# Patient Record
Sex: Female | Born: 1995 | Hispanic: No | Marital: Married | Smoking: Current some day smoker
Health system: Southern US, Community
[De-identification: ages and names within clinical notes are randomized; demographics above are authoritative.]

## PROBLEM LIST (undated history)

## (undated) DIAGNOSIS — R87629 Unspecified abnormal cytological findings in specimens from vagina: Secondary | ICD-10-CM

## (undated) DIAGNOSIS — I10 Essential (primary) hypertension: Secondary | ICD-10-CM

## (undated) DIAGNOSIS — F319 Bipolar disorder, unspecified: Secondary | ICD-10-CM

## (undated) DIAGNOSIS — F32A Depression, unspecified: Secondary | ICD-10-CM

## (undated) DIAGNOSIS — F419 Anxiety disorder, unspecified: Secondary | ICD-10-CM

## (undated) HISTORY — DX: Essential (primary) hypertension: I10

## (undated) HISTORY — PX: SUPRAPUBIC CATHETER INSERTION: SUR719

## (undated) HISTORY — DX: Unspecified abnormal cytological findings in specimens from vagina: R87.629

## (undated) HISTORY — DX: Anxiety disorder, unspecified: F41.9

## (undated) HISTORY — DX: Depression, unspecified: F32.A

## (undated) HISTORY — PX: CYSTOSCOPY: SUR368

---

## 2020-01-12 LAB — CYTOLOGY - PAP

## 2020-02-22 ENCOUNTER — Emergency Department (HOSPITAL_COMMUNITY)
Admission: EM | Admit: 2020-02-22 | Discharge: 2020-02-23 | Disposition: A | Discharge status: Home / Self Care | Payer: Medicare PPO | Attending: Emergency Medicine | Admitting: Emergency Medicine

## 2020-02-22 ENCOUNTER — Emergency Department (HOSPITAL_COMMUNITY): Payer: Medicare PPO

## 2020-02-22 DIAGNOSIS — Z3A21 21 weeks gestation of pregnancy: Secondary | ICD-10-CM

## 2020-02-22 DIAGNOSIS — Z20822 Contact with and (suspected) exposure to covid-19: Secondary | ICD-10-CM | POA: Insufficient documentation

## 2020-02-22 DIAGNOSIS — O26892 Other specified pregnancy related conditions, second trimester: Secondary | ICD-10-CM | POA: Diagnosis not present

## 2020-02-22 DIAGNOSIS — R109 Unspecified abdominal pain: Secondary | ICD-10-CM | POA: Diagnosis not present

## 2020-02-22 DIAGNOSIS — O99344 Other mental disorders complicating childbirth: Secondary | ICD-10-CM | POA: Insufficient documentation

## 2020-02-22 DIAGNOSIS — R4182 Altered mental status, unspecified: Secondary | ICD-10-CM

## 2020-02-22 DIAGNOSIS — F32A Depression, unspecified: Secondary | ICD-10-CM

## 2020-02-22 DIAGNOSIS — F1994 Other psychoactive substance use, unspecified with psychoactive substance-induced mood disorder: Secondary | ICD-10-CM

## 2020-02-22 DIAGNOSIS — O98512 Other viral diseases complicating pregnancy, second trimester: Secondary | ICD-10-CM | POA: Diagnosis not present

## 2020-02-22 DIAGNOSIS — F1910 Other psychoactive substance abuse, uncomplicated: Secondary | ICD-10-CM

## 2020-02-22 HISTORY — DX: Bipolar disorder, unspecified: F31.9

## 2020-02-22 LAB — COMPREHENSIVE METABOLIC PANEL
ALT: 23 U/L (ref 0–44)
AST: 25 U/L (ref 15–41)
Albumin: 3.1 g/dL — ABNORMAL LOW (ref 3.5–5.0)
Alkaline Phosphatase: 63 U/L (ref 38–126)
Anion gap: 10 (ref 5–15)
BUN: 6 mg/dL (ref 6–20)
CO2: 21 mmol/L — ABNORMAL LOW (ref 22–32)
Calcium: 8.8 mg/dL — ABNORMAL LOW (ref 8.9–10.3)
Chloride: 107 mmol/L (ref 98–111)
Creatinine, Ser: 0.7 mg/dL (ref 0.44–1.00)
GFR, Estimated: >60 mL/min (ref >60)
Glucose, Bld: 97 mg/dL (ref 70–99)
Potassium: 3.4 mmol/L — ABNORMAL LOW (ref 3.5–5.1)
Sodium: 138 mmol/L (ref 135–145)
Total Bilirubin: 0.6 mg/dL (ref 0.3–1.2)
Total Protein: 6.6 g/dL (ref 6.5–8.1)

## 2020-02-22 LAB — CBC
HCT: 37.5 % (ref 36.0–46.0)
Hemoglobin: 12 g/dL (ref 12.0–15.0)
MCH: 28.8 pg (ref 26.0–34.0)
MCHC: 32 g/dL (ref 30.0–36.0)
MCV: 89.9 fL (ref 80.0–100.0)
Platelets: 356 10*3/uL (ref 150–400)
RBC: 4.17 MIL/uL (ref 3.87–5.11)
RDW: 13.7 % (ref 11.5–15.5)
WBC: 7.2 10*3/uL (ref 4.0–10.5)
nRBC: 0 % (ref 0.0–0.2)

## 2020-02-22 LAB — LACTIC ACID, PLASMA: Lactic Acid, Venous: 1.4 mmol/L (ref 0.5–1.9)

## 2020-02-22 LAB — CBG MONITORING, ED: Glucose-Capillary: 75 mg/dL (ref 70–99)

## 2020-02-22 LAB — ETHANOL: Alcohol, Ethyl (B): <10 mg/dL (ref <10)

## 2020-02-22 LAB — I-STAT BETA HCG BLOOD, ED (MC, WL, AP ONLY): I-stat hCG, quantitative: >2000 m[IU]/mL — ABNORMAL HIGH (ref <5)

## 2020-02-22 LAB — ACETAMINOPHEN LEVEL: Acetaminophen (Tylenol), Serum: <10 ug/mL — ABNORMAL LOW (ref 10–30)

## 2020-02-22 LAB — SALICYLATE LEVEL: Salicylate Lvl: <7 mg/dL — ABNORMAL LOW (ref 7.0–30.0)

## 2020-02-22 MED ORDER — SODIUM CHLORIDE 0.9 % IV BOLUS
1000.0000 mL | Freq: Once | INTRAVENOUS | Status: AC
  Administered 2020-02-22: 1000 mL via INTRAVENOUS

## 2020-02-22 NOTE — ED Provider Notes (Signed)
[facility]-EMERGENCY DEPT [facility] Emergency Department Provider Note MRN:  [id number]  Arrival date & time: 02/22/20     Chief Complaint   Altered mental status History of Present Illness   Michelle [name] is a 24 y.o. year-old female with unknown past medical history presenting to the ED with chief complaint of altered mental status.  Patient is reportedly pregnant and there is concern that she overdosed on some type of drug or substance.  She is somnolent and unable to answer questions.  I was unable to obtain an accurate HPI, PMH, or ROS due to the patient's altered mental status.  Level 5 caveat.  Review of Systems  Positive for altered mental status  Patient's Health History   No past medical history on file.    No family history on file.  Social History   Socioeconomic History  . Marital status: Unknown    Spouse name: Not on file  . Number of children: Not on file  . Years of education: Not on file  . Highest education level: Not on file  Occupational History  . Not on file  Tobacco Use  . Smoking status: Not on file  Substance and Sexual Activity  . Alcohol use: Not on file  . Drug use: Not on file  . Sexual activity: Not on file  Other Topics Concern  . Not on file  Social History Narrative  . Not on file   Social Determinants of Health   Financial Resource Strain:   . Difficulty of Paying Living Expenses: Not on file  Food Insecurity:   . Worried About Programme researcher, broadcasting/film/video in the Last Year: Not on file  . Ran Out of Food in the Last Year: Not on file  Transportation Needs:   . Lack of Transportation (Medical): Not on file  . Lack of Transportation (Non-Medical): Not on file  Physical Activity:   . Days of Exercise per Week: Not on file  . Minutes of Exercise per Session: Not on file  Stress:   . Feeling of Stress : Not on file  Social Connections:   . Frequency of Communication with Friends and Family: Not on file  . Frequency of Social  Gatherings with Friends and Family: Not on file  . Attends Religious Services: Not on file  . Active Member of Clubs or Organizations: Not on file  . Attends Banker Meetings: Not on file  . Marital Status: Not on file  Intimate Partner Violence:   . Fear of Current or Ex-Partner: Not on file  . Emotionally Abused: Not on file  . Physically Abused: Not on file  . Sexually Abused: Not on file     Physical Exam   Vitals:   02/22/20 2200 02/22/20 2215  BP: 105/69 105/73  Pulse: 92 90  Resp: (!) 24 16  Temp:    SpO2: 100% 100%    CONSTITUTIONAL: Well-appearing, NAD NEURO: Somnolent, responds to voice, does not open eyes follows commands, moves all extremities 1 or 2 beats of clonus lower extremities, pupils 3 mm EYES:  eyes equal and reactive ENT/NECK:  no LAD, no JVD CARDIO: Regular rate, well-perfused, normal S1 and S2 PULM:  CTAB no wheezing or rhonchi GI/GU:  normal bowel sounds, non-distended, non-tender MSK/SPINE:  No gross deformities, no edema SKIN:  no rash, atraumatic PSYCH:  Appropriate speech and behavior  *Additional and/or pertinent findings included in MDM below  Diagnostic and Interventional Summary    EKG Interpretation  Date/Time:  Wednesday February 22 2020 18:56:30 EDT Ventricular Rate:  101 PR Interval:    QRS Duration: 80 QT Interval:  341 QTC Calculation: 442 R Axis:   46 Text Interpretation: Sinus tachycardia RSR' in V1 or V2, probably normal variant Baseline wander in lead(s) V5 No previous ECGs available Confirmed by Kennis Carina [phone]) on 02/22/2020 7:03:05 PM      Labs Reviewed  COMPREHENSIVE METABOLIC PANEL - Abnormal; Notable for the following components:      Result Value   Potassium 3.4 (*)    CO2 21 (*)    Calcium 8.8 (*)    Albumin 3.1 (*)    All other components within normal limits  ACETAMINOPHEN LEVEL - Abnormal; Notable for the following components:   Acetaminophen (Tylenol), Serum <10 (*)    All other  components within normal limits  SALICYLATE LEVEL - Abnormal; Notable for the following components:   Salicylate Lvl <7.0 (*)    All other components within normal limits  I-STAT BETA HCG BLOOD, ED ([facility], [facility], [facility] ONLY) - Abnormal; Notable for the following components:   I-stat hCG, quantitative >2,000.0 (*)    All other components within normal limits  RESPIRATORY PANEL BY RT PCR (FLU A&B, COVID)  CBC  ETHANOL  LACTIC ACID, PLASMA  URINALYSIS, ROUTINE W REFLEX MICROSCOPIC  RAPID URINE DRUG SCREEN, HOSP PERFORMED  PROTIME-INR  CBG MONITORING, ED    DG Chest Port 1 View  Final Result      Medications  sodium chloride 0.9 % bolus 1,000 mL (0 mLs Intravenous Stopped 02/22/20 2047)     Procedures  /  Critical Care .Critical Care Performed by: Sabas Sous, MD Authorized by: Sabas Sous, MD   Critical care provider statement:    Critical care time (minutes):  35   Critical care was necessary to treat or prevent imminent or life-threatening deterioration of the following conditions:  Toxidrome   Critical care was time spent personally by me on the following activities:  Discussions with consultants, evaluation of patient's response to treatment, examination of patient, ordering and performing treatments and interventions, ordering and review of laboratory studies, ordering and review of radiographic studies, pulse oximetry, re-evaluation of patient's condition, obtaining history from patient or surrogate and review of old charts    ED Course and Medical Decision Making  I have reviewed the triage vital signs, the nursing notes, and pertinent available records from the EMR.  Listed above are laboratory and imaging tests that I personally ordered, reviewed, and interpreted and then considered in my medical decision making (see below for details).  Concern for overdose as the cause of patient's altered mental status, she is able to follow commands and move all extremities, here in  the emergency department she is protecting airway, 100% on room air, normal heart rate and blood pressure.  Awaiting labs, will monitor closely.     Patient is awake, alert, conversant. Labs reassuring. Normal neurological exam at this time. She denies drug or alcohol use. She is tearful, she admits to recent depression. She denies SI or HI but seems to be withholding information. She would appreciate mental health evaluation today. Will consult TTS. She is medically cleared. She is pregnant, she is unsure how far along she is, she has not followed up, she is unsure of her last menstrual period. Signed out to default provider at shift change.  Elmer Sow. Pilar Plate, MD [facility] Emergency Medicine [facility] [email or web address]  Final Clinical Impressions(s) / ED  Diagnoses     ICD-10-CM   1. Altered mental status, unspecified altered mental status type  R41.82   2. Depression, unspecified depression type  F32.A     ED Discharge Orders    None       Discharge Instructions Discussed with and Provided to Patient:   Discharge Instructions   None       Sabas Sous, MD 02/22/20 2230

## 2020-02-22 NOTE — ED Notes (Signed)
Pt ambulated well to the BR at this time with no signs of gait/mobility issues.

## 2020-02-22 NOTE — ED Notes (Signed)
Pt given turkey sandwich and orange juice

## 2020-02-22 NOTE — ED Triage Notes (Addendum)
Pt arrived to ED via ems. Per ems boyfriend called because pt became unresponsive. Boyfriend admitted to "smoking some kind of drug"  Also reported pt is 5 months pregnant, but does not know any hx of pt. Upon ems arrival pt unresponsive, gcs 9, airway intact and was hypotensive. On arrival to ED pt responsive to pain, pupils are 78mm and reactive to light, no signs of trauma noted. No obvious distended uterus palpated.

## 2020-02-22 NOTE — ED Notes (Signed)
Pt was instructed to provide a urine sample if able to be processed. Upon return, pt stated she forgot to provide one.

## 2020-02-23 ENCOUNTER — Emergency Department (HOSPITAL_COMMUNITY): Payer: Medicare PPO

## 2020-02-23 ENCOUNTER — Encounter (HOSPITAL_COMMUNITY): Payer: Self-pay | Admitting: Emergency Medicine

## 2020-02-23 DIAGNOSIS — O99344 Other mental disorders complicating childbirth: Secondary | ICD-10-CM | POA: Diagnosis not present

## 2020-02-23 LAB — URINALYSIS, ROUTINE W REFLEX MICROSCOPIC
Bilirubin Urine: NEGATIVE
Glucose, UA: NEGATIVE mg/dL
Hgb urine dipstick: NEGATIVE
Ketones, ur: NEGATIVE mg/dL
Leukocytes,Ua: NEGATIVE
Nitrite: NEGATIVE
Protein, ur: NEGATIVE mg/dL
Specific Gravity, Urine: 1.017 (ref 1.005–1.030)
pH: 6 (ref 5.0–8.0)

## 2020-02-23 LAB — RESPIRATORY PANEL BY RT PCR (FLU A&B, COVID)
Influenza A by PCR: NEGATIVE
Influenza B by PCR: NEGATIVE
SARS Coronavirus 2 by RT PCR: NEGATIVE

## 2020-02-23 LAB — RAPID URINE DRUG SCREEN, HOSP PERFORMED
Amphetamines: POSITIVE — AB
Barbiturates: NOT DETECTED
Benzodiazepines: NOT DETECTED
Cocaine: NOT DETECTED
Opiates: NOT DETECTED
Tetrahydrocannabinol: POSITIVE — AB

## 2020-02-23 LAB — PROTIME-INR
INR: 1.1 (ref 0.8–1.2)
Prothrombin Time: 13.5 seconds (ref 11.4–15.2)

## 2020-02-23 LAB — HCG, QUANTITATIVE, PREGNANCY: hCG, Beta Chain, Quant, S: 15753 m[IU]/mL — ABNORMAL HIGH (ref <5)

## 2020-02-23 NOTE — ED Notes (Signed)
Patient transported to Ultrasound 

## 2020-02-23 NOTE — ED Notes (Signed)
TTS in process 

## 2020-02-23 NOTE — BH Assessment (Signed)
Comprehensive Clinical Assessment (CCA) Note  02/23/2020 Michelle [name] [id number]   Michelle [name] is a 24 year old female presenting voluntary to [facility] via EMS. When asked, why are you here, patient stated, "we were going on a walk when I passed out". Patient denied drug usage, attempted overdose and self-harming behaviors. Patient reported increased anxiety and denied depressive symptoms. Patient denied SI and HI. Patient reported visual hallucinations of seeing people talk about her. Patient denied any command verbal hallucinations. Patient denied history of psych inpatient treatment and suicide attempts. Patient reported being 5 months pregnant. Patient was calm and cooperative during assessment. Patient denied access to guns.   Collateral Contact Michelle [name], [phone], consented by patient. Michelle [name] reported no concern regarding patient hurting herself. Patient reported living with fiancy. Patient reported he and patient were going for a walk when she "black out". Michelle [name] reported that patient has been off her psych medications for 3 months. Per Michelle [name], no additional concerns regarding patient safety.   Per ED Triage Note Per ems boyfriend called because pt became unresponsive. Boyfriend admitted to "smoking some kind of drug"  Also reported pt is 5 months pregnant, but does not know any hx of pt. Upon ems arrival pt unresponsive, gcs 9, airway intact and was hypotensive. On arrival to ED pt responsive to pain, pupils are 41mm and reactive to light, no signs of trauma noted.   Disposition Michelle Conn, NP, recommends observation for safety and stabilization with psych reassessment in the AM.  Visit Diagnosis:      ICD-10-CM   1. Altered mental status, unspecified altered mental status type  R41.82   2. Depression, unspecified depression type  F32.A   3. Abdominal cramping  R10.9 US OB LESS THAN 14 WEEKS WITH OB TRANSVAGINAL    US OB LESS THAN 14 WEEKS WITH OB TRANSVAGINAL       CCA Screening, Triage and Referral (STR)  Patient Reported Information How did you hear about [place]? Family/Friend  Referral name: No data recorded Referral phone number: No data recorded  Whom do you see for routine medical problems? Primary Care  Practice/Facility Name: No data recorded Practice/Facility Phone Number: No data recorded Name of Contact: No data recorded Contact Number: No data recorded Contact Fax Number: No data recorded Prescriber Name: No data recorded Prescriber Address (if known): No data recorded  What Is the Reason for Your Visit/Call Today? No data recorded How Long Has This Been Causing You Problems? <Week  What Do You Feel Would Help You the Most Today? Medication   Have You Recently Been in Any Inpatient Treatment (Hospital/Detox/Crisis Center/28-Day Program)? No  Name/Location of Program/Hospital:No data recorded How Long Were You There? No data recorded When Were You Discharged? No data recorded  Have You Ever Received Services From [facility] Before? No  Who Do You See at [facility]? No data recorded  Have You Recently Had Any Thoughts About Hurting Yourself? Yes  Are You Planning to Commit Suicide/Harm Yourself At This time? Yes   Have you Recently Had Thoughts About Hurting Someone Michelle [name]? No  Explanation: No data recorded  Have You Used Any Alcohol or Drugs in the Past 24 Hours? No  How Long Ago Did You Use Drugs or Alcohol? No data recorded What Did You Use and How Much? No data recorded  Do You Currently Have a Therapist/Psychiatrist? No  Name of Therapist/Psychiatrist: No data recorded  Have You Been Recently Discharged From Any Office Practice or Programs? No  Explanation of  Discharge From Practice/Program: No data recorded    CCA Screening Triage Referral Assessment Type of Contact: Tele-Assessment  Is this Initial or Reassessment? Initial Assessment  Date Telepsych consult ordered in [facility]:  02/22/20  Time  Telepsych consult ordered in [facility]:  2337   Patient Reported Information Reviewed? Yes  Patient Left Without Being Seen? No data recorded Reason for Not Completing Assessment: No data recorded  Collateral Involvement: grandfather   Does Patient Have a Court Appointed Legal Guardian? No data recorded Name and Contact of Legal Guardian: No data recorded If Minor and Not Living with Parent(s), Who has Custody? No data recorded Is CPS involved or ever been involved? Never  Is APS involved or ever been involved? Never   Patient Determined To Be At Risk for Harm To Self or Others Based on Review of Patient Reported Information or Presenting Complaint? Yes, for Self-Harm  Method: No data recorded Availability of Means: No data recorded Intent: No data recorded Notification Required: No data recorded Additional Information for Danger to Others Potential: No data recorded Additional Comments for Danger to Others Potential: No data recorded Are There Guns or Other Weapons in Your Home? No data recorded Types of Guns/Weapons: No data recorded Are These Weapons Safely Secured?                            No data recorded Who Could Verify You Are Able To Have These Secured: No data recorded Do You Have any Outstanding Charges, Pending Court Dates, Parole/Probation? No data recorded Contacted To Inform of Risk of Harm To Self or Others: Family/Significant Other:   Location of Assessment: [facility] ED   Does Patient Present under Involuntary Commitment? No  IVC Papers Initial File Date: No data recorded  [place] of Residence: [place]   Patient Currently Receiving the Following Services: Not Receiving Services   Determination of Need: Emergent (2 hours)   Options For Referral: Inpatient Hospitalization;Medication Management;Intensive Outpatient Therapy     CCA Biopsychosocial  Intake/Chief Complaint:  CCA Intake With Chief Complaint Chief Complaint/Presenting Problem: SI with  attempted overdose Individual's Strengths: follows directions  Mental Health Symptoms Depression:  Depression: None  Mania:  Mania: None  Anxiety:   Anxiety: None  Psychosis:  Psychosis: None  Trauma:  Trauma: None  Obsessions:  Obsessions: None  Compulsions:  Compulsions: None  Inattention:  Inattention: None  Hyperactivity/Impulsivity:  Hyperactivity/Impulsivity: N/A  Oppositional/Defiant Behaviors:  Oppositional/Defiant Behaviors: None  Emotional Irregularity:  Emotional Irregularity: None  Other Mood/Personality Symptoms:  Other Mood/Personality Symptoms: pregnancy   Mental Status Exam Appearance and self-care  Stature:  Stature: Average  Weight:  Weight: Average weight  Clothing:  Clothing: Age-appropriate  Grooming:  Grooming: Normal  Cosmetic use:     Posture/gait:  Posture/Gait: Normal  Motor activity:  Motor Activity: Not Remarkable  Sensorium  Attention:     Concentration:  Concentration: Normal  Orientation:  Orientation: Object, Person, Place, Situation  Recall/memory:  Recall/Memory: Normal  Affect and Mood  Affect:  Affect: Anxious, Appropriate, Depressed  Mood:  Mood: Depressed  Relating  Eye contact:  Eye Contact: Normal  Facial expression:  Facial Expression: Sad, Anxious  Attitude toward examiner:  Attitude Toward Examiner: Cooperative, Hostile  Thought and Language  Speech flow:    Thought content:     Preoccupation:     Hallucinations:     Organization:     Company secretary of Knowledge:     Intelligence:  Abstraction:     Judgement:     Reality Testing:     Insight:     Decision Making:     Social Functioning  Social Maturity:     Social Judgement:     Stress  Stressors:     Coping Ability:     Skill Deficits:     Supports:        Religion:    Leisure/Recreation:    Exercise/Diet:     CCA Employment/Education  Employment/Work Situation: Employment / Work Situation Employment situation: Unemployed Has  patient ever been in the [employer] and Companymilitary?: No  Education: Education Is Patient Currently Attending School?: No Last Grade Completed: 8 Did Garment/textile technologistYou Graduate From [employer] School?: No Did You Product managerAttend College?: No Did Designer, television/film setYou Attend Graduate School?: No   CCA Family/Childhood History  Family and Relationship History: Family history Marital status: Long term relationship Does patient have children?: No  Childhood History:  Childhood History Did patient suffer any verbal/emotional/physical/sexual abuse as a child?: No Did patient suffer from severe childhood neglect?: No Has patient ever been sexually abused/assaulted/raped as an adolescent or adult?: No Was the patient ever a victim of a crime or a disaster?: No Witnessed domestic violence?: No Has patient been affected by domestic violence as an adult?: No  Child/Adolescent Assessment:     CCA Substance Use  Alcohol/Drug Use: Alcohol / Drug Use Pain Medications: see MAR Prescriptions: see MAR Over the Counter: see MAR History of alcohol / drug use?: No history of alcohol / drug abuse    Recommendations for Services/Supports/Treatments:    DSM5 Diagnoses: There are no problems to display for this patient.    Debbe OdeaLatisha D AlstonComprehensive Clinical Assessment (CCA) Screening, Triage and Referral Note  02/23/2020 Roney MarionDaniela [name] [id number]  Visit Diagnosis:    ICD-10-CM   1. Altered mental status, unspecified altered mental status type  R41.82   2. Depression, unspecified depression type  F32.A   3. Abdominal cramping  R10.9 US OB LESS THAN 14 WEEKS WITH OB TRANSVAGINAL    US OB LESS THAN 14 WEEKS WITH OB TRANSVAGINAL    Patient Reported Information How did you hear about us? Family/Friend   Referral name: No data recorded  Referral phone number: No data recorded Whom do you see for routine medical problems? Primary Care   Practice/Facility Name: No data recorded  Practice/Facility Phone Number: No data recorded  Name of  Contact: No data recorded  Contact Number: No data recorded  Contact Fax Number: No data recorded  Prescriber Name: No data recorded  Prescriber Address (if known): No data recorded What Is the Reason for Your Visit/Call Today? No data recorded How Long Has This Been Causing You Problems? <Week  Have You Recently Been in Any Inpatient Treatment (Hospital/Detox/Crisis Center/28-Day Program)? No   Name/Location of Program/Hospital:No data recorded  How Long Were You There? No data recorded  When Were You Discharged? No data recorded [facility]? [facility]   [facility] No data recorded Have You Recently Had Any Thoughts About Hurting Yourself? Yes   Are You Planning to Commit Suicide/Harm Yourself At This time?  Yes  Have you Recently Had Thoughts About Hurting Someone Michelle Ohslse? No   Explanation: No data recorded Have You Used Any Alcohol or Drugs in the Past 24 Hours? No   How Long Ago Did You Use Drugs or Alcohol?  No data recorded  What Did You Use and How Much?  No data recorded What Do You Feel Would Help You the Most Today? Medication  Do You Currently Have a Therapist/Psychiatrist? No   Name of Therapist/Psychiatrist: No data recorded  Have You Been Recently Discharged From Any Office Practice or Programs? No   Explanation of Discharge From Practice/Program:  No data recorded    CCA Screening Triage Referral Assessment Type of Contact: Tele-Assessment   Is this Initial or Reassessment? Initial Assessment   Date Telepsych consult ordered in [facility]:  02/22/20   Time Telepsych consult ordered in [facility]:  2337  Patient Reported Information Reviewed? Yes   Patient Left Without Being Seen? No data recorded  Reason for Not Completing Assessment: No data recorded Collateral Involvement: grandfather  Does Patient Have a Court Appointed Legal Guardian? No data recorded  Name and Contact of Legal Guardian:  No data  recorded If Minor and Not Living with Parent(s), Who has Custody? No data recorded Is CPS involved or ever been involved? Never  Is APS involved or ever been involved? Never  Patient Determined To Be At Risk for Harm To Self or Others Based on Review of Patient Reported Information or Presenting Complaint? Yes, for Self-Harm   Method: No data recorded  Availability of Means: No data recorded  Intent: No data recorded  Notification Required: No data recorded  Additional Information for Danger to Others Potential:  No data recorded  Additional Comments for Danger to Others Potential:  No data recorded  Are There Guns or Other Weapons in Your Home?  No data recorded   Types of Guns/Weapons: No data recorded   Are These Weapons Safely Secured?                              No data recorded   Who Could Verify You Are Able To Have These Secured:    No data recorded Do You Have any Outstanding Charges, Pending Court Dates, Parole/Probation? No data recorded Contacted To Inform of Risk of Harm To Self or Others: Family/Significant Other:  Location of Assessment: [facility] ED  Does Patient Present under Involuntary Commitment? No   IVC Papers Initial File Date: No data recorded  [place] of Residence: [place]  Patient Currently Receiving the Following Services: Not Receiving Services   Determination of Need: Emergent (2 hours)   Options For Referral: Inpatient Hospitalization;Medication Management;Intensive Outpatient Therapy   Burnetta Sabin, [facility]

## 2020-02-23 NOTE — ED Notes (Signed)
RN called OB to assess patient; EDP notified to consult with on call OB to determine need for [place]; Pt reports she feels baby move "all the time". Pt does not know LMP nor how far along she could be; On assessment patient does have a slight "baby bump" with firmness noted; Pt has no complaints at this time-Monique,RN

## 2020-02-23 NOTE — ED Notes (Signed)
Patient given phone to return call to [facility]

## 2020-02-23 NOTE — Patient Outreach (Signed)
ED Peer Support Specialist Patient Intake (Complete at intake & 30-60 Day Follow-up)  Name: Michelle [name]  MRN: [id number]  Age: 24 y.o.   Date of Admission: 02/23/2020  Intake: Initial Comments:      Primary Reason Admitted: Altered Mental Status   Lab values: Alcohol/ETOH: Positive Positive UDS? Yes Amphetamines: Yes Barbiturates: No Benzodiazepines: No Cocaine: No Opiates: No Cannabinoids: Yes  Demographic information: Gender: Female Ethnicity: African American Marital Status: Unknown Insurance Status: Medicaid Engineer, production assistance (Work Engineer, agricultural, Sales executive, etc.: Yes Lives with: Other (comment) Living situation: House/Apartment  Reported Patient History: Patient reported health conditions: Schizoaffective disorder, Depression, Anxiety disorders Patient aware of HIV and hepatitis status: No  In past year, has patient visited ED for any reason? No  Number of ED visits:    Reason(s) for visit:    In past year, has patient been hospitalized for any reason? Yes  Number of hospitalizations:    Reason(s) for hospitalization:    In past year, has patient been arrested? No  Number of arrests:    Reason(s) for arrest:    In past year, has patient been incarcerated? No  Number of incarcerations:    Reason(s) for incarceration:    In past year, has patient received medication-assisted treatment? No  In past year, patient received the following treatments:    In past year, has patient received any harm reduction services? No  Did this include any of the following?    In past year, has patient received care from a mental health provider for diagnosis other than SUD? No  In past year, is this first time patient has overdosed? No  Number of past overdoses:    In past year, is this first time patient has been hospitalized for an overdose? No  Number of hospitalizations for overdose(s):    Is patient currently receiving treatment  for a mental health diagnosis? No  Patient reports experiencing difficulty participating in SUD treatment: No    Most important reason(s) for this difficulty?    Has patient received prior services for treatment? No  In past, patient has received services from following agencies:    Plan of Care:  Suggested follow up at these agencies/treatment centers: ACTT ([facility]ive Community Treatment Team)  Other information: CPSS was able to complete series of questions but was unable to gain positive information about what services Pt was seeking. CPSS was made aware that Pt wanted to continue to be stable with her Medications. CPSS processed with Pt about ACTT services an explained to Pt how she may benefit from ACTT services. CPSS contacted ACTT team in front of Pt an was able to get Pt set up with an Assessment, on Mar 06, 2020 at 11:30. CPSS explained to Pt what she needs ro do an also that CPSS was assist Pt with getting set up during that time.     Arlys John Zyla Dascenzo, CPSS  02/23/2020 11:33 AM

## 2020-02-23 NOTE — ED Notes (Signed)
Patient verbalizes understanding of discharge instructions. Opportunity for questioning and answers were provided. Pt discharged from ED. 

## 2020-02-23 NOTE — Progress Notes (Signed)
Patient ID: Michelle [name], female   DOB: December 14, 1995, 24 y.o.   MRN: [id number]   Psychiatric Reassessment   HPI: Michelle [name] is a 24 year old female presenting voluntary to [facility] via EMS. When asked, why are you here, patient stated, "we were going on a walk when I passed out". Patient denied drug usage, attempted overdose and self-harming behaviors. Patient reported increased anxiety and denied depressive symptoms. Patient denied SI and HI. Patient reported visual hallucinations of seeing people talk about her. Patient denied any command verbal hallucinations. Patient denied history of psych inpatient treatment and suicide attempts. Patient reported being 5 months pregnant. Patient was calm and cooperative during assessment. Patient denied access to guns.   Collateral Contact Sharlet Salina, [phone], consented by patient. Molli Hazard reported no concern regarding patient hurting herself. Patient reported living with fiancy. Patient reported he and patient were going for a walk when she "black out". Molli Hazard reported that patient has been off her psych medications for 3 months. Per Molli Hazard, no additional concerns regarding patient safety.   Per ED Triage Note Per ems boyfriend called because pt became unresponsive. Boyfriend admitted to "smoking some kind of drug" Also reported pt is 5 months pregnant, but does not know any hx of pt. Upon ems arrival pt unresponsive, gcs 9, airway intact and was hypotensive. On arrival to ED pt responsive to pain, pupils are 63mm and reactive to light, no signs of trauma noted.   Psychiatric Evaluation 02/23/2020: Michelle [name] is a 24 year old female presenting voluntary to [facility] via EMS. Per review of chart, patient was found unresponsives by her boyfriend who stated she had smoked some kind of drug. Patient was then transported to the ED for evaluation.  During this evaluation, patient was alert and oriented x4, calm and cooperative. She stated that  she did not recall much of the incident prior to arrival to the ED but did recall that she and her boyfriend had plans to go walking. Stated she told her boyfriend that she was not feeling well and when she stood up, she must have passed out. She denied using any substances prior to the incident and initially denied history of substance abuse. When advised that her UDS was positive for amphetamines and mariajuana,  She admitted to using meth although stated the last time she used was a week ago. She admitted to using marijuana but denied other substance abuse. When asked more questions about her substance abuse history she mostly replied," I cant remember or I don't know." She was very vague about her substance abuse and insight was poor. She denied current suicidal or homicidal thoughts. .She reported both auditory and visual hallucinations described as," I cant understands the voices. I cant describe what I see." She insist that the hallucination occur without meth use. Reported a psychiatric history of Bipolar, schizophrenia, depression and anxiety but stated she has not taken her medications in 5 months after finding out that he was pregnant  Reported she does receive prenatal care but could not recall where. Reported she receives outpatient psychiatric services with Dr. Jonny Ruiz at the [facility]. Reported prior suicide attempts but stated she could not remember the last attempt. Denied history of NSSIB. Denied access to firearms. Denied feelings of depression. When we discussed her interest in substance abuse services and that resources could be provided she declined.  She gave verbal consent to speak to her fiance Molli Hazard, [phone]  I spoke to patients fiance who stated he had no concerns  that patient  would hurt herself or that she tried to hurt herself.Reported his concern was what caused her to pass out. He was advised that I could not provide additional information in regards to patients medically  reports however, psychiatrically, patient was stable and did not quire an inpatient psychiatric admission. He was receptive. He endorsed no other concerns at this time.   .   Disposition: Patient denies SI, HI and psychosis. Although she has a history of substance abuse, she minimizes. She stated that she was bot open to resources for substance treatment although I have asked CSW to fax over resources. Stated that she would continue to follow-up with the [facility] for outpatient psychiatric services and follow-up with her doctor for longing [facility] care.    At this time, there is no evidence of imminent risk to self or others at present nor evidence of psychosis. Patient does not meet criteria for psychiatric inpatient admission and is therefore, psychiatrically cleared.    ED updated on disposition.

## 2020-02-23 NOTE — ED Notes (Signed)
ED Provider at bedside. 
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
## 2020-02-23 NOTE — ED Notes (Signed)
Updated pt's fiance, Molli Hazard (with pt's permission); Molli Hazard to pick up pt in Ed lobby in approx 30 minutes

## 2020-02-23 NOTE — BH Assessment (Signed)
Jason Berry, NP, recommends overnight observation for safety and stabilization with psych reassessment in the AM.   

## 2020-02-23 NOTE — Progress Notes (Signed)
Pt has been psychiatrically cleared by Denzil Magnuson, NP. Outpatient contact information has been placed in pt's AVS.    Wells Guiles, MSW, LCSW, LCAS Clinical Social Worker II Disposition CSW [phone]

## 2020-02-23 NOTE — Progress Notes (Signed)
3664: PA called OBRRN about pt 21.5 GA via ultrasound. Came in with thought of potential overdose w/ altered mental status. After pt seen by PA A&O and told she had a syncopal episode and does not remember anything until getting to hospital. No hx of episode like this before. VSS. Doppler performed at 0043 at 166bpm. PA questioning if pt could be OB cleared.   4034: Dr. Macon Large called and notified of pt G3P1, 21.5 GA via ultrasound. Came in with thought of potential overdose w/ altered mental status. After pt seen by PA A&O and told she had a syncopal episode and does not remember anything until getting to hospital. No hx of episode like this before. VSS. Doppler performed at 0043 at 166bpm. PA questioning if pt could be OB cleared. Pt OB cleared at this time.  7425: PA notified that pt is OB cleared at this time.

## 2020-02-23 NOTE — ED Provider Notes (Signed)
Patient has been assessed by [facility] team, who indicates patient is psych clear for discharge.   Patient has normal mood and affect. Is pleasant, cooperative, and conversant. Patient denies any thoughts of harm to self or others. No SI.   Patient indicates she already has plans in place to follow up with OB/GYn next week.   Patient indicates she feels ready for d/c. No physical or behavioral complaints at this time.   Pt currently appears stable for d/c.   Peer support referral and resources also provided.      Cathren Laine, MD 02/23/20 1013

## 2020-02-23 NOTE — ED Notes (Addendum)
Psych cleared, awaiting TTS note.

## 2020-02-23 NOTE — ED Notes (Addendum)
TSS in progress.  

## 2020-02-23 NOTE — Discharge Instructions (Signed)
It was our pleasure to provide your ER care today - we hope that you feel better.  Follow up with OB/GYN doctor next week as planned.   Avoid any drug use - see resource guide provided for treatment and counseling options.   Return to ER if worse, new symptoms, new or severe pain, fevers, trouble breathing, or other emergency concern.   Please follow up with one of the following outpatient mental health providers:  [facility] * (Intensive outpatient, partial hospitalization, individual therapy, and medication management) [address], [address] [place], [place] [address] [phone]  [facility] * (medication management and therapy) [address] [place], [place] [address] [phone]  [facility] (Therapy only)* (takes [place] and most major insurances) [place]:  [address] [facility] [place], [place]  Phone: [phone] [place]:  [address], [place], [place] [address]   Phone: [phone] [place]:  [address] [place], [place] [address]  Phone: [phone]  [facility]  (specializes in trauma therapy, substance abuse and mood disorders. Takes patients without insurance for little to no cost out of pocket. Also takes most major insurances) [address],  [place], [place] [address] [phone]  [facility] * (therapy and medication management) [address] [place], [place] [address] [phone]  [facility] (Medication Management) [address] [place], [place] [address] [phone]  [facility] * (therapy only) [address] [place], [place] [address] [phone]  [facility] [address] [place] [address] [phone]  [facility] * [address] [place], [place] [address] [phone]  [facility]* (accepts non-insured)  [address]  [place], [place] [address] [phone] Walk in Monday-Friday 8am-3pm  [facility]* (takes sliding scale and Medicaid as well as other major insurances)  [place]:   [address] [place]  Phone: [phone] [place]:   [facility] [address] [place], [place]  Phone: [phone] Walk in Monday-Friday 830am-230pm

## 2020-02-23 NOTE — ED Notes (Signed)
RN spoke with Sharlet Salina who can be reached at [phone] for Collateral information; Fiance denies any Behavioral issues of patient and states he was with her when EMS was called; RN to pass information to TTS-Monique,RN

## 2020-02-24 LAB — ABO/RH: ABO/RH(D): O POS

## 2020-03-27 ENCOUNTER — Telehealth: Payer: Self-pay | Admitting: Lactation Services

## 2020-03-27 NOTE — Telephone Encounter (Signed)
Patient called and LM on nurse voicemail wanting to know if we had received her Records Release Form.   Called patient back and she wants to know if we had received her paperwork form [facility] in [place]. [place]. Patient reports she filled our ROI form last week in the office.   Routed to front office for advisement.

## 2020-04-02 ENCOUNTER — Telehealth: Payer: Self-pay | Admitting: Family Medicine

## 2020-04-02 NOTE — Telephone Encounter (Signed)
Attempted to reach Michelle [name] about her records request. [facility], and was told the records were sent on the 3rd of December. Records are to be resent because it says processing, and she doesn't know why it says that.

## 2020-04-12 ENCOUNTER — Other Ambulatory Visit: Payer: Self-pay

## 2020-04-12 ENCOUNTER — Telehealth (INDEPENDENT_AMBULATORY_CARE_PROVIDER_SITE_OTHER): Payer: Medicare PPO | Admitting: *Deleted

## 2020-04-12 ENCOUNTER — Telehealth: Payer: Self-pay | Admitting: General Practice

## 2020-04-12 DIAGNOSIS — F419 Anxiety disorder, unspecified: Secondary | ICD-10-CM | POA: Insufficient documentation

## 2020-04-12 DIAGNOSIS — F3289 Other specified depressive episodes: Secondary | ICD-10-CM

## 2020-04-12 DIAGNOSIS — Z3A00 Weeks of gestation of pregnancy not specified: Secondary | ICD-10-CM

## 2020-04-12 DIAGNOSIS — F319 Bipolar disorder, unspecified: Secondary | ICD-10-CM

## 2020-04-12 DIAGNOSIS — Z79899 Other long term (current) drug therapy: Secondary | ICD-10-CM

## 2020-04-12 DIAGNOSIS — O0990 Supervision of high risk pregnancy, unspecified, unspecified trimester: Secondary | ICD-10-CM

## 2020-04-12 DIAGNOSIS — O0930 Supervision of pregnancy with insufficient antenatal care, unspecified trimester: Secondary | ICD-10-CM

## 2020-04-12 DIAGNOSIS — R87610 Atypical squamous cells of undetermined significance on cytologic smear of cervix (ASC-US): Secondary | ICD-10-CM

## 2020-04-12 DIAGNOSIS — F32A Depression, unspecified: Secondary | ICD-10-CM | POA: Insufficient documentation

## 2020-04-12 MED ORDER — BLOOD PRESSURE KIT DEVI: 1.0000 | 0 refills | Status: DC | PRN

## 2020-04-12 NOTE — Telephone Encounter (Signed)
Patient called and left message stating she is trying to reach [place] to return a missed phone call.

## 2020-04-12 NOTE — Progress Notes (Signed)
0830 Michelle [name] not connected virtually for her appointment. I called her and verified her Identity with 2 identifiers. I asked if she is ready for her virtual visit and explained how to join me virtually. Michelle Holleman,RN  New OB Intake  Michelle [name] unable to connect virtually. Visit completed by phone.   I connected with  Michelle [name] on 04/12/20 at 0840 by telephone and verified that I am speaking with the correct person using two identifiers. Nurse is located at [name] and pt is located at home.  I discussed the limitations, risks, security and privacy concerns of performing an evaluation and management service by telephone and the availability of in person appointments. I also discussed with the patient that there may be a patient responsible charge related to this service. The patient expressed understanding and agreed to proceed.  I explained I am completing New OB Intake today. We discussed her EDD of 06/30/2020 that is based on [place] due to unknown LMP. Pt is G3/P1011. I reviewed her allergies, medications, Medical/Surgical/OB history, and appropriate screenings. I informed her of Michelle [name] services. Due to her history I informed her we recommend referral to [name] [place] and she said that was fine; she isn't seeing anyone right now.  Patient and/or legal guardian verbally consented to Michelle [name] services about presenting concerns and psychiatric consultation as appropriate.  Based on history, this is a/an complicated by late prenatal care;on adderall/ history of drug use pregnancy.  Patient on adderall ; unsure of dose . States the prescribing doctor said it was ok in pregnancy.  Discussed with Michelle [name] and informed patient not usually recommended in pregnancy but at this stage would not advise stopping suddenly. Will be discussed with her at her new ob visit.  I asked her to bring the bottle of medicine with her so we can verify dosage.   Concerns addressed today  Delivery plans States  plans to deliver at [name].   MyChart/Babyscripts MyChart access verified. I explained pt will have some visits in office and some virtually. Babyscripts instructions given. .  Blood Pressure Cuff Blood pressure cuff ordered for patient to pick-up from [facility]. Explained after first prenatal appt pt will check weekly and document in Babyscripts.  Anatomy US Informed her will  schedule for first available- MFM will call  us back with appointment - trying to work patient in as soon as possible. Patient will be notified by MyChart.  Labs Discussed Michelle [name] genetic screening with patient. Would like both Panorama and Horizon drawn at new OB visit. Routine prenatal labs needed.  Michelle [name] Patient states she has Michelle [name].   New Patient Zoom Meeting Informed patient of optional new patient and placed information in her AVS.   COVID vaccination Patient at first thought she had been vaccinated but then realized she meant covid tested. States she has not had the vaccine.   First visit review I reviewed new OB appt with pt. I explained she will have a pelvic exam, ob bloodwork with genetic screening, and PAP smear. Explained pt will be seen by Dr. Adrian [name] at first visit; encounter routed to appropriate provider.  Michelle Badeaux,RN 04/12/2020  8:41 AM

## 2020-04-12 NOTE — Patient Instructions (Signed)
- At our [facility], we work as an integrated team, providing care to address both physical and emotional health. Your medical provider may refer you to see our Behavioral Health Clinician (BHC) on the same day you see your medical provider, as availability permits; often scheduled virtually at your convenience.  Our BHC is available to all patients, visits generally last between 20-30 minutes, but can be longer or shorter, depending on patient need. The BHC offers help with stress management, coping with symptoms of depression and anxiety, major life changes , sleep issues, changing risky behavior, grief and loss, life stress, working on personal life goals, and  behavioral health issues, as these all affect your overall health and wellness.  The BHC is NOT available for the following: FMLA paperwork, court-ordered evaluations, specialty assessments (custody or disability), letters to employers, or obtaining certification for an emotional support animal. The BHC does not provide long-term therapy. You have the right to refuse integrated behavioral health services, or to reschedule to see the BHC at a later date.  Confidentiality exception: If it is suspected that a child or disabled adult is being abused or neglected, we are required by law to report that to either Child Protective Services or Adult Protective Services.  If you have a diagnosis of Bipolar affective disorder, Schizophrenia, or recurrent Major depressive disorder, we will recommend that you establish care with a psychiatrist, as these are lifelong, chronic conditions, and we want your overall emotional health and medications to be more closely monitored. If you anticipate needing extended maternity leave due to mental health issues postpartum, it it recommended you inform your medical provider, so we can put in a referral to a  psychiatrist as soon as possible. The BHC is unable to recommend an extended maternity leave for mental  health issues. Your medical provider or BHC may refer you to a therapist for ongoing, traditional therapy, or to a psychiatrist, for medication management, if it would benefit your overall health. Depending on your insurance, you may have a copay to see the BHC. If you are uninsured, it is recommended that you apply for financial assistance. (Forms may be requested at the front desk for in-person visits, via MyChart, or request a form during a virtual visit).  If you see the BHC more than 6 times, you will have to complete a comprehensive clinical assessment interview with the BHC to resume integrated services.  For virtual visits with the BHC, you must be physically in the state of [place] at the time of the visit. For example, if you live in [place], you will have to do an in-person visit with the BHC, and your out-of-state insurance may not cover behavioral health services in [place]. f you are going out of the state or country for any reason, the BHC may see you virtually when you return to [place], but not while you are physically outside of [place].     Meet the Provider Zoom Sessions      [facility] is now offering FREE monthly 1-hour virtual Zoom sessions for new, current, and prospective patients.        During these sessions, you can:   Learn about our practice, model of care, services   Get answers to questions about pregnancy and birth during COVID   Pick your provider's brain about anything else!    Sessions will be hosted by [facility] Nurse Practitioners, Physician Assistants, Physicians and Midwives            No registration required      2021 Dates:      All at 6pm     October 21st     November 18th   December 16th     January 20th  February 17th    To join one of these meetings, a few minutes before it is set to start:     Copy/paste the link into your web  browser:  [email or web address]    OR  Scan the QR code below (open up your camera and point towards QR code; click on tab that pops up on your phone ("zoom")    

## 2020-04-13 ENCOUNTER — Ambulatory Visit: Payer: Medicare PPO | Attending: Obstetrics and Gynecology

## 2020-04-13 ENCOUNTER — Ambulatory Visit: Payer: Medicare PPO

## 2020-04-13 NOTE — Telephone Encounter (Signed)
Called pt and left message stating that I am returning her call. She may send a Mychart message or call the office back.

## 2020-04-25 ENCOUNTER — Other Ambulatory Visit: Payer: Self-pay | Admitting: Lactation Services

## 2020-04-25 MED ORDER — PREPLUS 27-1 MG PO TABS: 1.0000 | ORAL_TABLET | Freq: Every day | ORAL | 11 refills | Status: AC

## 2020-04-26 ENCOUNTER — Encounter: Payer: Self-pay | Admitting: Family Medicine

## 2020-04-26 ENCOUNTER — Other Ambulatory Visit: Payer: Self-pay

## 2020-04-26 ENCOUNTER — Ambulatory Visit (INDEPENDENT_AMBULATORY_CARE_PROVIDER_SITE_OTHER): Payer: Medicare PPO | Admitting: Family Medicine

## 2020-04-26 VITALS — BP 115/63 | HR 95 | Wt 118.3 lb

## 2020-04-26 DIAGNOSIS — F909 Attention-deficit hyperactivity disorder, unspecified type: Secondary | ICD-10-CM

## 2020-04-26 DIAGNOSIS — Z23 Encounter for immunization: Secondary | ICD-10-CM

## 2020-04-26 DIAGNOSIS — O0930 Supervision of pregnancy with insufficient antenatal care, unspecified trimester: Secondary | ICD-10-CM | POA: Diagnosis not present

## 2020-04-26 DIAGNOSIS — N322 Vesical fistula, not elsewhere classified: Secondary | ICD-10-CM | POA: Insufficient documentation

## 2020-04-26 DIAGNOSIS — F319 Bipolar disorder, unspecified: Secondary | ICD-10-CM

## 2020-04-26 DIAGNOSIS — O0990 Supervision of high risk pregnancy, unspecified, unspecified trimester: Secondary | ICD-10-CM

## 2020-04-26 DIAGNOSIS — O0993 Supervision of high risk pregnancy, unspecified, third trimester: Secondary | ICD-10-CM | POA: Diagnosis not present

## 2020-04-26 DIAGNOSIS — O34219 Maternal care for unspecified type scar from previous cesarean delivery: Secondary | ICD-10-CM

## 2020-04-26 DIAGNOSIS — F3289 Other specified depressive episodes: Secondary | ICD-10-CM

## 2020-04-26 LAB — HEMOGLOBIN A1C
Est. average glucose Bld gHb Est-mCnc: 105 mg/dL
Hgb A1c MFr Bld: 5.3 % (ref 4.8–5.6)

## 2020-04-26 LAB — POCT URINALYSIS DIP (DEVICE)
Bilirubin Urine: NEGATIVE
Glucose, UA: NEGATIVE mg/dL
Hgb urine dipstick: NEGATIVE
Ketones, ur: NEGATIVE mg/dL
Leukocytes,Ua: NEGATIVE
Nitrite: NEGATIVE
Protein, ur: NEGATIVE mg/dL
Specific Gravity, Urine: >=1.03 (ref 1.005–1.030)
Urobilinogen, UA: 0.2 mg/dL (ref 0.0–1.0)
pH: 6 (ref 5.0–8.0)
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
## 2020-04-26 NOTE — Progress Notes (Addendum)
NEW OB/ 28 wk packet provided  tdap and flu vaccine today BTL paperwork signed  Home Medicaid Form completed

## 2020-04-27 LAB — CBC/D/PLT+RPR+RH+ABO+RUB AB...
Antibody Screen: NEGATIVE
Basophils Absolute: 0 10*3/uL (ref 0.0–0.2)
Basos: 1 %
EOS (ABSOLUTE): 0.1 10*3/uL (ref 0.0–0.4)
Eos: 1 %
HCV Ab: <0.1 s/co ratio (ref 0.0–0.9)
HIV Screen 4th Generation wRfx: NONREACTIVE
Hematocrit: 31.4 % — ABNORMAL LOW (ref 34.0–46.6)
Hemoglobin: 10.3 g/dL — ABNORMAL LOW (ref 11.1–15.9)
Hepatitis B Surface Ag: NEGATIVE
Immature Grans (Abs): 0 10*3/uL (ref 0.0–0.1)
Immature Granulocytes: 1 %
Lymphocytes Absolute: 2.3 10*3/uL (ref 0.7–3.1)
Lymphs: 38 %
MCH: 26.2 pg — ABNORMAL LOW (ref 26.6–33.0)
MCHC: 32.8 g/dL (ref 31.5–35.7)
MCV: 80 fL (ref 79–97)
Monocytes Absolute: 0.6 10*3/uL (ref 0.1–0.9)
Monocytes: 10 %
Neutrophils Absolute: 3.1 10*3/uL (ref 1.4–7.0)
Neutrophils: 49 %
Platelets: 268 10*3/uL (ref 150–450)
RBC: 3.93 x10E6/uL (ref 3.77–5.28)
RDW: 13.7 % (ref 11.7–15.4)
RPR Ser Ql: NONREACTIVE
Rh Factor: POSITIVE
Rubella Antibodies, IGG: 3.96 index (ref >0.99)
WBC: 6.1 10*3/uL (ref 3.4–10.8)

## 2020-04-27 LAB — HCV INTERPRETATION

## 2020-04-28 LAB — URINE CULTURE, OB REFLEX

## 2020-04-28 LAB — CULTURE, OB URINE

## 2020-04-30 ENCOUNTER — Encounter: Payer: Self-pay | Admitting: *Deleted

## 2020-04-30 ENCOUNTER — Other Ambulatory Visit: Payer: Self-pay | Admitting: Obstetrics and Gynecology

## 2020-05-01 ENCOUNTER — Other Ambulatory Visit: Payer: Self-pay

## 2020-05-01 DIAGNOSIS — O0990 Supervision of high risk pregnancy, unspecified, unspecified trimester: Secondary | ICD-10-CM

## 2020-05-03 ENCOUNTER — Other Ambulatory Visit: Payer: Self-pay

## 2020-05-03 ENCOUNTER — Other Ambulatory Visit: Payer: Medicare PPO

## 2020-05-03 DIAGNOSIS — O0990 Supervision of high risk pregnancy, unspecified, unspecified trimester: Secondary | ICD-10-CM

## 2020-05-04 LAB — GLUCOSE TOLERANCE, 2 HOURS W/ 1HR
Glucose, 1 hour: 166 mg/dL (ref 65–179)
Glucose, 2 hour: 106 mg/dL (ref 65–152)
Glucose, Fasting: 71 mg/dL (ref 65–91)

## 2020-05-07 NOTE — BH Specialist Note (Signed)
Integrated Behavioral Health via Telemedicine Visit  05/07/2020 Michelle [name] [id number]  Number of Integrated Behavioral Health visits: 1 Session Start time: 9:22  Session End time: 9:39 Total time: 17  Referring Provider: Candelaria Celeste, DO Patient/Family location: Home [facility] Provider location: [facility]  All persons participating in visit: [name] and Michelle [name]   Types of Service: Telephone visit  I connected with Michelle [name] and/or Michelle [name]'s n/a by Telephone  (Video is Surveyor, mining) and verified that I am speaking with the correct person using two identifiers.Discussed confidentiality: Yes   I discussed the limitations of telemedicine and the availability of in person appointments.  Discussed there is a possibility of technology failure and discussed alternative modes of communication if that failure occurs.  I discussed that engaging in this telemedicine visit, they consent to the provision of behavioral healthcare and the services will be billed under their insurance.  Patient and/or legal guardian expressed understanding and consented to Telemedicine visit: Yes   Presenting Concerns: Patient and/or family reports the following symptoms/concerns: Pt states her primary symptom today is fatigue; no other questions or concerns at this time. Pt is taking prenatal vitamins daily, sleeping and eating well, and has supportive sister.  Duration of problem: Ongoing; Severity of problem: mild  Patient and/or Family's Strengths/Protective Factors: Social connections  Goals Addressed: Patient will: 1.  Increase knowledge and/or ability of: healthy habits   Progress towards Goals: Ongoing  Interventions: Interventions utilized:  Solution-Focused Strategies, Psychoeducation and/or Health Education and Link to [employer] Standardized Assessments completed: GAD-7 and  PHQ 9  Patient and/or Family Response: Pt agrees to treatment plan  Assessment: Patient currently experiencing Bipolar disorder, remission status unspecified  Patient may benefit from psychoeducation and brief therapeutic interventions regarding maintaining reduction of symptoms of depression and anxiety  Plan: 1. Follow up with behavioral health clinician on : Call Michelle [name] at [phone] as needed 2. Behavioral recommendations:  -Continue taking prenatal vitamin daily -Consider viewing virtual tour of hospital at [email or web address]; consider reading through Postpartum Planner (on After Visit Summary)  3. Referral(s): Integrated Behavioral Health  I discussed the assessment and treatment plan with the patient and/or parent/guardian. They were provided an opportunity to ask questions and all were answered. They agreed with the plan and demonstrated an understanding of the instructions.   They were advised to call back or seek an in-person evaluation if the symptoms worsen or if the condition fails to improve as anticipated.  Michelle Lips, LCSW   Depression screen [facility] 2/9 05/18/2020 04/26/2020 04/12/2020  Decreased Interest 0 1 0  Down, Depressed, Hopeless 0 1 0  PHQ - 2 Score 0 2 0  Altered sleeping 0 0 1  Tired, decreased energy 3 1 3   Change in appetite 0 0 0  Feeling bad or failure about yourself  0 0 0  Trouble concentrating 0 0 3  Moving slowly or fidgety/restless 0 0 0  Suicidal thoughts 0 0 0  PHQ-9 Score 3 3 7    GAD 7 : Generalized Anxiety Score 05/18/2020 04/26/2020 04/12/2020  Nervous, Anxious, on Edge 0 0 0  Control/stop worrying 0 0 1  Worry too much - different things 0 0 1  Trouble relaxing 0 0 1  Restless 0 0 0  Easily annoyed or irritable 0 0 1  Afraid - awful might happen 0 0 1  Total GAD 7 Score 0 0 5

## 2020-05-14 ENCOUNTER — Ambulatory Visit: Payer: Medicare PPO

## 2020-05-14 ENCOUNTER — Encounter: Payer: Medicare PPO | Admitting: Obstetrics and Gynecology

## 2020-05-15 ENCOUNTER — Encounter: Payer: Medicare PPO | Admitting: Advanced Practice Midwife

## 2020-05-17 ENCOUNTER — Ambulatory Visit (HOSPITAL_BASED_OUTPATIENT_CLINIC_OR_DEPARTMENT_OTHER): Payer: Medicare PPO

## 2020-05-17 ENCOUNTER — Encounter: Payer: Self-pay | Admitting: *Deleted

## 2020-05-17 ENCOUNTER — Other Ambulatory Visit: Payer: Self-pay | Admitting: *Deleted

## 2020-05-17 ENCOUNTER — Ambulatory Visit: Payer: Medicare PPO | Attending: Obstetrics and Gynecology | Admitting: *Deleted

## 2020-05-17 ENCOUNTER — Other Ambulatory Visit: Payer: Self-pay

## 2020-05-17 DIAGNOSIS — F419 Anxiety disorder, unspecified: Secondary | ICD-10-CM

## 2020-05-17 DIAGNOSIS — F319 Bipolar disorder, unspecified: Secondary | ICD-10-CM | POA: Diagnosis not present

## 2020-05-17 DIAGNOSIS — O0930 Supervision of pregnancy with insufficient antenatal care, unspecified trimester: Secondary | ICD-10-CM

## 2020-05-17 DIAGNOSIS — F3289 Other specified depressive episodes: Secondary | ICD-10-CM

## 2020-05-17 DIAGNOSIS — Z3A33 33 weeks gestation of pregnancy: Secondary | ICD-10-CM | POA: Diagnosis not present

## 2020-05-17 DIAGNOSIS — O10913 Unspecified pre-existing hypertension complicating pregnancy, third trimester: Secondary | ICD-10-CM | POA: Diagnosis not present

## 2020-05-17 DIAGNOSIS — O99333 Smoking (tobacco) complicating pregnancy, third trimester: Secondary | ICD-10-CM | POA: Diagnosis not present

## 2020-05-17 DIAGNOSIS — O0993 Supervision of high risk pregnancy, unspecified, third trimester: Secondary | ICD-10-CM | POA: Diagnosis present

## 2020-05-17 DIAGNOSIS — O99343 Other mental disorders complicating pregnancy, third trimester: Secondary | ICD-10-CM | POA: Diagnosis not present

## 2020-05-17 DIAGNOSIS — R87610 Atypical squamous cells of undetermined significance on cytologic smear of cervix (ASC-US): Secondary | ICD-10-CM

## 2020-05-17 DIAGNOSIS — O0933 Supervision of pregnancy with insufficient antenatal care, third trimester: Secondary | ICD-10-CM | POA: Diagnosis not present

## 2020-05-17 DIAGNOSIS — O0990 Supervision of high risk pregnancy, unspecified, unspecified trimester: Secondary | ICD-10-CM | POA: Diagnosis not present

## 2020-05-18 ENCOUNTER — Telehealth: Payer: Self-pay

## 2020-05-18 ENCOUNTER — Ambulatory Visit (INDEPENDENT_AMBULATORY_CARE_PROVIDER_SITE_OTHER): Payer: Medicare PPO | Admitting: Clinical

## 2020-05-18 DIAGNOSIS — F319 Bipolar disorder, unspecified: Secondary | ICD-10-CM

## 2020-05-18 NOTE — Patient Instructions (Addendum)
[facility] for [facility] at [facility] [place], [place] [address] [phone] (main office) [phone] [facility] office)   [email or web address] Diplomatic Services operational officer tour of hospital; Register for classes, etc.)       BRAINSTORMING  Develop a Plan Goals:  Provide a way to start conversation about your new life with a baby  Assist parents in recognizing and using resources within their reach  Help pave the way before birth for an easier period of transition afterwards.  Make a list of the following information to keep in a central location:  Full name of Mom and Partner: _____________________________________________  43 full name and Date of Birth: ___________________________________________  Home Address: ___________________________________________________________ ________________________________________________________________________  Home Phone: ____________________________________________________________  Parents' cell numbers: _____________________________________________________ ________________________________________________________________________  Name and contact info for OB: ______________________________________________  Name and contact info for Pediatrician:________________________________________  Contact info for Lactation Consultants: ________________________________________  REST and SLEEP *You each need at least 4-5 hours of uninterrupted sleep every day. Write specific names and contact information.*  How are you going to rest in the postpartum period? While partner's home? When partner returns to work? When you both return to work?  Where will your baby sleep?  Who is available to help during the day? Evening? Night?  Who could move in for a period to help support you?  What are some ideas to help you get enough  sleep? __________________________________________________________________________________________________________________________________________________________________________________________________________________________________________ NUTRITIOUS FOOD AND DRINK *Plan for meals before your baby is born so you can have healthy food to eat during the immediate postpartum period.*  Who will look after breakfast? Lunch? Dinner? List names and contact information. Brainstorm quick, healthy ideas for each meal.  What can you do before baby is born to prepare meals for the postpartum period?  How can others help you with meals?  Which grocery stores provide online shopping and delivery?  Which restaurants offer take-out or delivery options? ______________________________________________________________________________________________________________________________________________________________________________________________________________________________________________________________________________________________________________________________________________________________________________________________________  CARE FOR MOM *It's important that mom is cared for and pampered in the postpartum period. Remember, the most important ways new mothers need care are: sleep, nutrition, gentle exercise, and time off.*  Who can come take care of mom during this period? Make a list of people with their contact information.  List some activities that make you feel cared for, rested, and energized? Who can make sure you have opportunities to do these things?  Does mom have a space of her very own within your home that's just for her? Make a [facility] where she can be comfortable, rest, and renew herself  daily. ______________________________________________________________________________________________________________________________________________________________________________________________________________________________________________________________________________________________________________________________________________________________________________________________________    CARE FOR AND FEEDING BABY *Knowledgeable and encouraging people will offer the best support with regard to feeding your baby.*  Educate yourself and choose the best feeding option for your baby.  Make a list of people who will guide, support, and be a resource for you as your care for and feed your baby. (Friends that have breastfed or are currently breastfeeding, lactation consultants, breastfeeding support groups, etc.)  Consider a postpartum doula. (These websites can give you information: dona.org & BuyingShow.es)  Seek out local breastfeeding resources like the breastfeeding support group at [employer] or [employer]. ______________________________________________________________________________________________________________________________________________________________________________________________________________________________________________________________________________________________________________________________________________________________________________________________________  Verner Chol AND ERRANDS  Who can help with a thorough cleaning before baby is born?  Make a list of people who will help with housekeeping and chores, like laundry, light cleaning, dishes, bathrooms, etc.  Who can run some errands for you?  What can you do to make sure you are stocked with  basic supplies before baby is born?  Who is going to do the  shopping? ______________________________________________________________________________________________________________________________________________________________________________________________________________________________________________________________________________________________________________________________________________________________________________________________________     Family Adjustment *Nurture yourselvesit helps parents be more loving and allows for better bonding with their child.*  What sorts of things do you and partner enjoy doing together? Which activities help you to connect and strengthen your relationship? Make a list of those things. Make a list of people whom you trust to care for your baby so you can have some time together as a couple.  What types of things help partner feel connected to Mom? Make a list.  What needs will partner have in order to bond with baby?  Other children? Who will care for them when you go into labor and while you are in the hospital?  Think about what the needs of your older children might be. Who can help you meet those needs? In what ways are you helping them prepare for bringing baby home? List some specific strategies you have for family adjustment. _______________________________________________________________________________________________________________________________________________________________________________________________________________________________________________________________________________________________________________________________________________  SUPPORT *Someone who can empathize with experiences normalizes your problems and makes them more bearable.*  Make a list of other friends, neighbors, and/or co-workers you know with infants (and small children, if applicable) with whom you can connect.  Make a list of local or online support groups, mom groups, etc. in which you can be  involved. ______________________________________________________________________________________________________________________________________________________________________________________________________________________________________________________________________________________________________________________________________________________________________________________________________  Childcare Plans  Investigate and plan for childcare if mom is returning to work.  Talk about mom's concerns about her transition back to work.  Talk about partner's concerns regarding this transition.  Mental Health *Your mental health is one of the highest priorities for a pregnant or postpartum mom.*  1 in 5 women experience anxiety and/or depression from the time of conception through the first year after birth.  Postpartum Mood Disorders are the #1 complication of pregnancy and childbirth and the suffering experienced by these mothers is not necessary! These illnesses are temporary and respond well to treatment, which often includes self-care, social support, talk therapy, and medication when needed.  Women experiencing anxiety and depression often say things like: I'm supposed to be happywhy do I feel so sad?, Why can't I snap out of it?, I'm having thoughts that scare me.  There is no need to be embarrassed if you are feeling these symptoms: o Overwhelmed, anxious, angry, sad, guilty, irritable, hopeless, exhausted but can't sleep o You are NOT alone. You are NOT to blame. With help, you WILL be well.  Where can I find help? Medical professionals such as your OB, midwife, gynecologist, family practitioner, primary care provider, pediatrician, or mental health providers; [facility] support groups: Feelings After Birth, Breastfeeding Support Group, Baby and Me Group, and Fit 4 Two exercise classes.  You have permission to ask for help. It will confirm your feelings, validate your  experiences, share/learn coping strategies, and gain support and encouragement as you heal. You are important! BRAINSTORM  Make a list of local resources, including resources for mom and for partner.  Identify support groups.  Identify people to call late at night - include names and contact info.  Talk with partner about perinatal mood and anxiety disorders.  Talk with your OB, midwife, and doula about baby blues and about perinatal mood and anxiety disorders.  Talk with your pediatrician about perinatal mood and anxiety disorders.   Support & Sanity Savers   What do you really need?   Basics  In preparing for a new baby, many expectant parents spend  hours shopping for baby clothes, decorating the nursery, and deciding which car seat to buy. Yet most don't think much about what the reality of parenting a newborn will be like, and what they need to make it through that. So, here is the advice of experienced parents. We know you'll read this, and think they're exaggerating, I don't really need that. Just trust [place] on these, OK? Plan for all of this, and if it turns out you don't need it, come back and teach [place] how you did it!   Must-Haves (Once baby's survival needs are met, make sure you attend to your own survival needs!)  Sleep  An average newborn sleeps 16-18 hours per day, over 6-7 sleep periods, rarely more than three hours at a time. It is normal and healthy for a newborn to wake throughout the night... but really hard on parents!!  Naps. Prioritize sleep above any responsibilities like: cleaning house, visiting friends, running errands, etc.  Sleep whenever baby sleeps. If you can't nap, at least have restful times when baby eats. The more rest you get, the more patient you will be, the more emotionally stable, and better at solving problems.   Food  You may not have realized it would be difficult to eat when you have a newborn. Yet, when we talk to  countless new  parents, they say things like it may be 2:00 pm when I realize I haven't had breakfast yet. Or every time we sit down to dinner, baby needs to eat, and my food gets cold, so I don't bother to eat it.  Finger food. Before your baby is born, stock up with one months' worth of food that: 1) you can eat with one hand while holding a baby, 2) doesn't need to be prepped, 3) is good hot or cold, 4) doesn't spoil when left out for a few hours, and 5) you like to eat. Think about: nuts, dried fruit, Clif bars, pretzels, jerky, gogurt, baby carrots, apples, bananas, crackers, cheez-n-crackers, string cheese, hot pockets or frozen burritos to microwave, garden burgers and breakfast pastries to put in the toaster, yogurt drinks, etc.  Restaurant Menus. Make lists of your favorite restaurants & menu items. When family/friends want to help, you can give specific information without much thought. They can either bring you the food or send gift cards for just the right meals.  Freezer Meals.  Take some time to make a few meals to put in the freezer ahead of time.  Easy to freeze meals can be anything such as soup, lasagna, chicken pie, or spaghetti sauce.  Set up a Meal Schedule.  Ask friends and family to sign up to bring you meals during the first few weeks of being home. (It can be passed around at baby showers!) You have no idea how helpful this will be until you are in the throes of parenting.  https://hamilton-woodard.[email or web address]/ is a great website to check out.  Emotional Support  Know who to call when you're stressed out. Parenting a newborn is very challenging work. There are times when it totally overwhelms your normal coping abilities. EVERY NEW PARENT NEEDS TO HAVE A PLAN FOR WHO TO CALL WHEN THEY JUST CAN'T COPE ANY MORE. (And it has to be someone other than the baby's other parent!) Before your baby is born, come up with at least one person you can call for support - write their phone number down and post it on the  refrigerator.  Anxiety & Sadness.  Baby blues are normal after pregnancy; however, there are more severe types of anxiety & sadness which can occur and should not be ignored.  They are always treatable, but you have to take the first step by reaching out for help. [facility] offers a Mom Talk group which meets every Tuesday from 10 am - 11 am.  This group is for new moms who need support and connection after their babies are born.  Call [phone].   Really, Really Helpful (Plan for them! Make sure these happen often!!)  Physical Support with Taking Care of Yourselves  Asking friends and family. Before your baby is born, set up a schedule of people who can come and visit and help out (or ask a friend to schedule for you). Any time someone says let me know what I can do to help, sign them up for a day. When they get there, their job is not to take care of the baby (that's your job and your joy). Their job is to take care of you!   Postpartum doulas. If you don't have anyone you can call on for support, look into postpartum doulas:  professionals at helping parents with caring for baby, caring for themselves, getting breastfeeding started, and helping with household tasks. www.padanc.org is a helpful website for learning about doulas in our area.  Peer Support / Parent Groups  Why: One of the greatest ideas for new parents is to be around other new parents. Parent groups give you a chance to share and listen to others who are going through the same season of life, get a sense of what is normal infant development by watching several babies learn and grow, share your stories of triumph and struggles with empathetic ears, and forgive your own mistakes when you realize all parents are learning by trial and error.  Where to find: There are many places you can meet other new parents throughout our community.  [facility] offers the following classes for new moms and their little ones:  Baby  and Me (Birth to [place]) and Breastfeeding Support Group. Go to www.conehealthybaby.com or call [phone] for more information.  Time for your Relationship  It's easy to get so caught up in meeting baby's immediate needs that it's hard to find time to connect with your partner, and meet the needs of your relationship. It's also easy to forget what quality time with your partner actually looks like. If you take your baby on a date, you'd be amazed how much of your couple time is spent feeding the baby, diapering the baby, admiring the baby, and talking about the baby.  Dating: Try to take time for just the two of you. Babysitter tip: Sometimes when moms are breastfeeding a newborn, they find it hard to figure out how to schedule outings around baby's unpredictable feeding schedules. Have the babysitter come for a three hour period. When she comes over, if baby has just eaten, you can leave right away, and come back in two hours. If baby hasn't fed recently, you start the date at home. Once baby gets hungry and gets a good feeding in, you can head out for the rest of your date time.  Date Nights at Home: If you can't get out, at least set aside one evening a week to prioritize your relationship: whenever baby dozes off or doesn't have any immediate needs, spend a little time focusing on each other.  Potential conflicts: The main relationship conflicts that come up for  new parents are: issues related to sexuality, financial stresses, a feeling of an unfair division of household tasks, and conflicts in parenting styles. The more you can work on these issues before baby arrives, the better!   Fun and Frills (Don't forget these and don't feel guilty for indulging in them!)  Everyone has something in life that is a fun little treat that they do just for themselves. It may be: reading the morning paper, or going for a daily jog, or having coffee with a friend once a week, or going to a movie on Friday  nights, or fine chocolates, or bubble baths, or curling up with a good book.  Unless you do fun things for yourself every now and then, it's hard to have the energy for fun with your baby. Whatever your special treats are, make sure you find a way to continue to indulge in them after your baby is born. These special moments can recharge you, and allow you to return to baby with a new joy   PERINATAL MOOD DISORDERS: [place]   Emergency and Crisis Resources:  If you are an imminent risk to self or others, are experiencing intense personal distress, and/or have noticed significant changes in activities of daily living, call:   [facility]: [place]: [place]: [phone] Or visit the following crisis centers:  Local Emergency Departments  [facility]: [address] [place]. Hours: 8:30AM-5PM. Insurance Accepted: Medicaid, Medicare, and Uninsured.   [facility]  [address] [place] Mon-Friday 8am-3pm  [phone]                                                                                    Non-Crisis Resources: To identify specific providers that are covered by your insurance, contact your insurance company or local agencies: [place]: [phone] [facility] and [place]: [phone] [place]: [phone] Postpartum Support International- [place] 1-(986) 184-2116                                                      Outpatient therapy and medication management providers:  [facility] [phone] Hours: 9AM-5PM  Insurance Accepted: Alben Spittle, Lorella Nimrod, Freddrick March, [place], Medicare  [facility] ([place]) [phone] Hours: 8AM-5PM  nsurance Accepted: All insurances EXCEPT AARP, [place], [place], and  [place]: [phone]             Hours: 8AM-8PM Insurance Accepted: Cristal Ford, Freddrick March, [place], Medicare, [phone] [facility]: [phone] Hours: 8:30AM-7PM Insurance Accepted: Cristal Ford, Medicaid, Medicare, Tricare, [employer] Counseling:  [place] Accepted:  Holland Falling, Lorella Nimrod, [employer], [employer], [facility] [phone] Hours: 9AM-5:30PM Insurance Accepted: Alben Spittle, [place], [place], and Medicaid, [employer], [employer]  [facility] [facility]:  [phone] Hours: 9am-5pm Insurance Accepted: BCBS; they do not accept Medicaid/Medicare [place]: [phone] Hours: 9am-9pm Insurance Accepted: All major insurance including Medicaid and Medicare [facility]: [phone] Hours: 9AM-5:30PM Insurance Accepted: All insurances EXCEPT Medicaid and Medicare. [facility]: [place]: [phone] [place]:  [place] (support for children in the NICU and/or with special needs), [place] [employer]: [phone]                                                                                     Online Resources: Postpartum Support International: http://jones-berg.com/  800-944-4PPD 2Moms Supporting Moms:  www.momssupportingmoms.net

## 2020-05-18 NOTE — Telephone Encounter (Addendum)
-----   Message from Olevia Bowens sent at 05/16/2020 10:05 AM EST ----- Regarding: FW: INVALID BTL What is the status of this consent?  ----- Message ----- From: Olevia Bowens Sent: 04/30/2020   2:38 PM EST To: [facility] Clinical Pool Subject: INVALID BTL                                    Her DOB is over-corrected, please have her resign ASAP, RCS & BTL scheduled for 2/26   Called pt and requested she come by the office during open hours next week to resign BTL consent. Pt agreeable.

## 2020-05-21 ENCOUNTER — Telehealth (INDEPENDENT_AMBULATORY_CARE_PROVIDER_SITE_OTHER): Payer: Medicare PPO | Admitting: Obstetrics & Gynecology

## 2020-05-21 ENCOUNTER — Encounter: Payer: Self-pay | Admitting: Urology

## 2020-05-21 ENCOUNTER — Ambulatory Visit: Payer: Medicare PPO

## 2020-05-21 ENCOUNTER — Encounter: Payer: Self-pay | Admitting: Obstetrics & Gynecology

## 2020-05-21 VITALS — BP 140/98 | HR 89

## 2020-05-21 DIAGNOSIS — O0933 Supervision of pregnancy with insufficient antenatal care, third trimester: Secondary | ICD-10-CM | POA: Diagnosis not present

## 2020-05-21 DIAGNOSIS — D649 Anemia, unspecified: Secondary | ICD-10-CM

## 2020-05-21 DIAGNOSIS — O163 Unspecified maternal hypertension, third trimester: Secondary | ICD-10-CM

## 2020-05-21 DIAGNOSIS — O99891 Other specified diseases and conditions complicating pregnancy: Secondary | ICD-10-CM

## 2020-05-21 DIAGNOSIS — F419 Anxiety disorder, unspecified: Secondary | ICD-10-CM

## 2020-05-21 DIAGNOSIS — O26893 Other specified pregnancy related conditions, third trimester: Secondary | ICD-10-CM

## 2020-05-21 DIAGNOSIS — R87610 Atypical squamous cells of undetermined significance on cytologic smear of cervix (ASC-US): Secondary | ICD-10-CM

## 2020-05-21 DIAGNOSIS — O99343 Other mental disorders complicating pregnancy, third trimester: Secondary | ICD-10-CM

## 2020-05-21 DIAGNOSIS — O99013 Anemia complicating pregnancy, third trimester: Secondary | ICD-10-CM

## 2020-05-21 DIAGNOSIS — O0930 Supervision of pregnancy with insufficient antenatal care, unspecified trimester: Secondary | ICD-10-CM

## 2020-05-21 DIAGNOSIS — F32A Depression, unspecified: Secondary | ICD-10-CM

## 2020-05-21 DIAGNOSIS — O0990 Supervision of high risk pregnancy, unspecified, unspecified trimester: Secondary | ICD-10-CM

## 2020-05-21 DIAGNOSIS — R030 Elevated blood-pressure reading, without diagnosis of hypertension: Secondary | ICD-10-CM

## 2020-05-21 DIAGNOSIS — Z3A34 34 weeks gestation of pregnancy: Secondary | ICD-10-CM

## 2020-05-21 NOTE — Progress Notes (Addendum)
I connected with Michelle [name] 05/21/20 at  1:15 PM EST by: MyChart video and verified that I am speaking with the correct person using two identifiers.  Patient is located at home and provider is  located at [facility].  Could not get video to work so called pt on the phone for visit.   The purpose of this virtual visit is to provide medical care while limiting exposure to the novel coronavirus. I discussed the limitations, risks, security and privacy concerns of performing an evaluation and management service by MyChart video and the availability of in person appointments. I also discussed with the patient that there may be a patient responsible charge related to this service. By engaging in this virtual visit, you consent to the provision of healthcare.  Additionally, you authorize for your insurance to be billed for the services provided during this visit.  The patient expressed understanding and agreed to proceed.  The following staff members participated in the virtual visit:      PRENATAL VISIT NOTE  Subjective:  Michelle [name] is a 25 y.o. G3P1011 at [redacted]w[redacted]d  for phone visit for ongoing prenatal care.  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  Patient reports a lot of fatigue.  Contractions: Irritability. Vag. Bleeding: None.  Movement: Present. Denies leaking of fluid.   Denies headache or visual changes, RUQ pain.  She has minimal swelling in her feet.    The following portions of the patient's history were reviewed and updated as appropriate: allergies, current medications, past family history, past medical history, past social history, past surgical history and problem list.   Objective:   Vitals:    05/21/20 1322  BP: (!) 140/98  Pulse: 89   Self-Obtained  Fetal Status:     Movement: Present     Assessment and Plan:  Pregnancy: G3P1011 at [redacted]w[redacted]d 1. Supervision of high risk pregnancy, antepartum - Has growth scan scheduled 06/07/2020 - Needs cultures 2 weeks - needs to sign BTL papers  2. ASCUS of cervix with negative high risk HPV  3. Anxiety  4. Depression, unspecified depression type  5. Late prenatal care affecting pregnancy, antepartum  6.  Anemia affecting pregnancy - recommended starting FeSo4 every other day  7.  Elevated blood pressure - recommended pt come for nurse visit for BP check today if at all possible, bring BP cuff and have possible r/o pre-eclampsia lab work   Preterm labor symptoms and general obstetric precautions including but not limited to vaginal bleeding, contractions, leaking of fluid and fetal movement were reviewed in detail with the patient.  No follow-ups on file.  Future Appointments  Date Time Provider Department Center  05/21/2020  2:30 PM [facility] NURSE [facility]  06/07/2020  2:15 PM [facility] NURSE [facility]  06/07/2020  2:30 PM [facility] US3 [facility]  06/11/2020  2:00 PM McKenzie, Mardene Celeste, MD [facility] None     Time spent on virtual visit: 15 minutes  Jerene Bears, MD

## 2020-05-21 NOTE — Progress Notes (Signed)
I connected with  Michelle [name] on 05/21/20 at  1:15 PM EST by telephone and verified that I am speaking with the correct person using two identifiers.   I discussed the limitations, risks, security and privacy concerns of performing an evaluation and management service by telephone and the availability of in person appointments. I also discussed with the patient that there may be a patient responsible charge related to this service. The patient expressed understanding and agreed to proceed.  Isabell Jarvis, RN 05/21/2020  1:20 PM

## 2020-05-22 ENCOUNTER — Ambulatory Visit: Payer: Medicare PPO

## 2020-05-28 ENCOUNTER — Encounter: Payer: Self-pay | Admitting: General Practice

## 2020-06-07 ENCOUNTER — Ambulatory Visit: Payer: Medicare PPO | Attending: Obstetrics and Gynecology | Admitting: *Deleted

## 2020-06-07 ENCOUNTER — Encounter: Payer: Self-pay | Admitting: *Deleted

## 2020-06-07 ENCOUNTER — Other Ambulatory Visit: Payer: Self-pay

## 2020-06-07 ENCOUNTER — Ambulatory Visit (HOSPITAL_BASED_OUTPATIENT_CLINIC_OR_DEPARTMENT_OTHER): Payer: Medicare PPO

## 2020-06-07 DIAGNOSIS — O10013 Pre-existing essential hypertension complicating pregnancy, third trimester: Secondary | ICD-10-CM | POA: Diagnosis not present

## 2020-06-07 DIAGNOSIS — O0930 Supervision of pregnancy with insufficient antenatal care, unspecified trimester: Secondary | ICD-10-CM | POA: Diagnosis present

## 2020-06-07 DIAGNOSIS — O0933 Supervision of pregnancy with insufficient antenatal care, third trimester: Secondary | ICD-10-CM

## 2020-06-07 DIAGNOSIS — O34219 Maternal care for unspecified type scar from previous cesarean delivery: Secondary | ICD-10-CM

## 2020-06-07 DIAGNOSIS — O99333 Smoking (tobacco) complicating pregnancy, third trimester: Secondary | ICD-10-CM | POA: Diagnosis not present

## 2020-06-07 DIAGNOSIS — Z3A36 36 weeks gestation of pregnancy: Secondary | ICD-10-CM | POA: Diagnosis not present

## 2020-06-07 DIAGNOSIS — Z363 Encounter for antenatal screening for malformations: Secondary | ICD-10-CM

## 2020-06-07 DIAGNOSIS — O10913 Unspecified pre-existing hypertension complicating pregnancy, third trimester: Secondary | ICD-10-CM | POA: Diagnosis not present

## 2020-06-07 DIAGNOSIS — F419 Anxiety disorder, unspecified: Secondary | ICD-10-CM

## 2020-06-07 DIAGNOSIS — O0990 Supervision of high risk pregnancy, unspecified, unspecified trimester: Secondary | ICD-10-CM

## 2020-06-07 DIAGNOSIS — R87610 Atypical squamous cells of undetermined significance on cytologic smear of cervix (ASC-US): Secondary | ICD-10-CM

## 2020-06-08 ENCOUNTER — Encounter: Payer: Medicare PPO | Admitting: Obstetrics and Gynecology

## 2020-06-11 ENCOUNTER — Ambulatory Visit: Payer: Medicare PPO | Admitting: Urology

## 2020-06-12 ENCOUNTER — Telehealth (HOSPITAL_COMMUNITY): Payer: Self-pay | Admitting: *Deleted

## 2020-06-12 NOTE — Telephone Encounter (Signed)
Preadmission screen  

## 2020-06-12 NOTE — Patient Instructions (Signed)
Michelle [name]  06/12/2020   Your procedure is scheduled on:  06/23/2020  Arrive at 1000 at Entrance C on [employer] at [facility]  and [employer]. You are invited to use the FREE valet parking or use the Visitor's parking deck.  Pick up the phone at the desk and dial [phone].  Call this number if you have problems the morning of surgery: [phone]  Remember:   Do not eat food:(After Midnight) Desps de medianoche.  Do not drink clear liquids: (After Midnight) Desps de medianoche.  Take these medicines the morning of surgery with A SIP OF WATER:  none   Do not wear jewelry, make-up or nail polish.  Do not wear lotions, powders, or perfumes. Do not wear deodorant.  Do not shave 48 hours prior to surgery.  Do not bring valuables to the hospital.  [facility] is not   responsible for any belongings or valuables brought to the hospital.  Contacts, dentures or bridgework may not be worn into surgery.  Leave suitcase in the car. After surgery it may be brought to your room.  For patients admitted to the hospital, checkout time is 11:00 AM the day of              discharge.      Please read over the following fact sheets that you were given:     Preparing for Surgery

## 2020-06-14 ENCOUNTER — Telehealth (HOSPITAL_COMMUNITY): Payer: Self-pay | Admitting: *Deleted

## 2020-06-14 NOTE — Telephone Encounter (Signed)
Preadmission screen  

## 2020-06-15 ENCOUNTER — Telehealth: Payer: Self-pay

## 2020-06-15 ENCOUNTER — Encounter (HOSPITAL_COMMUNITY): Payer: Self-pay

## 2020-06-15 NOTE — Telephone Encounter (Signed)
Pt called requesting refills.  Left message for pt that I am calling in regards to refill question.  If she is calling about a refill on her PNV she just needs to contact [facility] on [address] for the refill.  If she continues to have questions please give the office a call back.    Leonette Nutting  06/15/20

## 2020-06-18 ENCOUNTER — Other Ambulatory Visit: Payer: Self-pay

## 2020-06-18 ENCOUNTER — Ambulatory Visit (INDEPENDENT_AMBULATORY_CARE_PROVIDER_SITE_OTHER): Payer: Medicare PPO | Admitting: Obstetrics & Gynecology

## 2020-06-18 ENCOUNTER — Encounter (HOSPITAL_COMMUNITY): Payer: Self-pay

## 2020-06-18 ENCOUNTER — Other Ambulatory Visit (HOSPITAL_COMMUNITY)
Admission: RE | Admit: 2020-06-18 | Discharge: 2020-06-18 | Disposition: A | Discharge status: Home / Self Care | Payer: Medicare PPO | Source: Ambulatory Visit | Attending: Obstetrics and Gynecology | Admitting: Obstetrics and Gynecology

## 2020-06-18 VITALS — BP 134/74 | HR 87 | Wt 132.0 lb

## 2020-06-18 DIAGNOSIS — O0930 Supervision of pregnancy with insufficient antenatal care, unspecified trimester: Secondary | ICD-10-CM

## 2020-06-18 DIAGNOSIS — O0990 Supervision of high risk pregnancy, unspecified, unspecified trimester: Secondary | ICD-10-CM | POA: Diagnosis not present

## 2020-06-18 DIAGNOSIS — O34219 Maternal care for unspecified type scar from previous cesarean delivery: Secondary | ICD-10-CM | POA: Diagnosis not present

## 2020-06-18 DIAGNOSIS — O163 Unspecified maternal hypertension, third trimester: Secondary | ICD-10-CM

## 2020-06-18 DIAGNOSIS — N322 Vesical fistula, not elsewhere classified: Secondary | ICD-10-CM | POA: Diagnosis not present

## 2020-06-18 NOTE — Progress Notes (Signed)
   PRENATAL VISIT NOTE  Subjective:  Michelle [name] is a 25 y.o. G3P1011 at [redacted]w[redacted]d being seen today for ongoing prenatal care.  She is currently monitored for the following issues for this high-risk pregnancy and has Supervision of high risk pregnancy, antepartum; Bipolar disorder (HCC); Anxiety; Depression; Late prenatal care affecting pregnancy, antepartum; ASCUS of cervix with negative high risk HPV; Vesicocutaneous fistula; Elevated blood pressure affecting pregnancy in third trimester, antepartum; Anemia affecting pregnancy in third trimester; and Previous cesarean delivery, antepartum on their problem list.  Patient reports no complaints.  Contractions: Irritability. Vag. Bleeding: None.  Movement: Present. Denies leaking of fluid.   The following portions of the patient's history were reviewed and updated as appropriate: allergies, current medications, past family history, past medical history, past social history, past surgical history and problem list.   Objective:   Vitals:   06/18/20 1034  BP: 134/74  Pulse: 87  Weight: 132 lb (59.9 kg)    Fetal Status: Fetal Heart Rate (bpm): 122   Movement: Present     General:  Alert, oriented and cooperative. Patient is in no acute distress.  Skin: Skin is warm and dry. No rash noted.   Cardiovascular: Normal heart rate noted  Respiratory: Normal respiratory effort, no problems with respiration noted  Abdomen: Soft, gravid, appropriate for gestational age.  Pain/Pressure: Present     Pelvic: Cervical exam deferred        Extremities: Normal range of motion.  Edema: None  Mental Status: Normal mood and affect. Normal behavior. Normal judgment and thought content.   Assessment and Plan:  Pregnancy: G3P1011 at [redacted]w[redacted]d 1. Supervision of high risk pregnancy, antepartum Routine screen - GC/Chlamydia probe amp ([place])not at [facility] - Culture, beta strep (group b only)  2. Late prenatal care affecting pregnancy, antepartum 30 weeks  NOB  3. Vesicocutaneous fistula After pelvic injury, repair to be done at CS  4. Previous cesarean delivery, antepartum Scheduled repeat 39 weeks  Term labor symptoms and general obstetric precautions including but not limited to vaginal bleeding, contractions, leaking of fluid and fetal movement were reviewed in detail with the patient. Please refer to After Visit Summary for other counseling recommendations.   No follow-ups on file.  Future Appointments  Date Time Provider Department Center  06/21/2020  9:30 AM [facility]-LD PAT 1 [facility]-INDC None  06/21/2020 10:30 AM MC-SCREENING [facility]-SDSC None    Scheryl Darter, MD

## 2020-06-18 NOTE — Patient Instructions (Signed)
AREA PEDIATRIC/FAMILY PRACTICE PHYSICIANS  [place] ([address]) . [facility] o Chambliss, MD; Eniola, MD; Hale, MD; Hensel, MD; McDiarmid, MD; McIntyer, MD; Neal, MD; Walden, MD o [address] [place], [place] [address] o [phone] o Mon-Fri 8:30-12:30, 1:30-5:00 o Providers come to see babies at [facility] o Accepting Medicaid . [facility] o Limited providers who accept newborns: Koirala, MD; Morrow, MD; Wolters, MD o [address], [place], [place] [address] o [phone] o Mon-Fri 8:00-5:30 o Babies seen by providers at [facility] o Does NOT accept Medicaid o Please call early in hospitalization for appointment (limited availability)  . [facility] o Mulberry, MD o [address] [place], [place] [address] o [phone] o Mon, Tue, Thur, Fri 8:30-5:00, Wed 10:00-7:00 (closed 1-2pm) o Babies seen by [facility] providers o Accepting Medicaid . [facility] o Rubin, MD o [address], [place], [place] [address] o [phone] o Mon-Fri 8:30-5:00, Sat 8:30-12:00 o Provider comes to see babies at [facility] o Accepting Medicaid o Must have been referred from current patients or contacted office prior to delivery . [facility] ([facility]) o Brown, MD; Chandler, MD; Ettefagh, MD; Grant, MD; Lester, MD; McCormick, MD; McQueen, MD; Prose, MD; Simha, MD; Stanley, MD; Stryffeler, NP; Tebben, NP o [address], [place], [place] [address] o [phone] o Mon, Tue, Thur, Fri 8:30-5:30, Wed 9:30-5:30, Sat 8:30-12:30 o Babies seen by [facility] providers o Accepting Medicaid o Only accepting infants of first-time parents or siblings of current patients o Hospital discharge coordinator will make follow-up appointment . Jack Amos o [address],  [place], [place]  [address] o [phone]   Fax - [phone] . [facility] o [address], [place], [place]  [address] o Phone - [phone]   Fax - [phone] . Shilpa Gosrani o [address], [place], [place]  [address] o [phone]  [place] ([address]) . [facility] o Bates, MD; [place]assfield, MD; Cooper, Cox, MD; MD; Davis, MD; Dovico, MD; Ettefaugh, MD; Little, MD; Lowe, MD; Keiffer, MD; Melvin, MD; Sumner, MD; [place], MD o [address], [place], [place] [address] o [phone] o Mon-Fri 8:30-5:00 (extended evenings Mon-Thur as needed), Sat-Sun 10:00-1:00 o Providers come to see babies at [facility] o Accepting Medicaid for families of first-time babies and families with all children in the household age 3 and under. Must register with office prior to making appointment (M-F only). . [facility] o Henson, NP; Knapp, MD; Lalonde, MD; Tysinger, PA o [address] [place], [place] [address] o [phone] o Mon-Fri 8:00-5:00 o Babies seen by providers at [facility] o Does NOT accept Medicaid/Commercial Insurance Only . [facility] ([facility])  o Artis, MD; Barnes, MD; Bratton, MD; Coccaro, MD; Lockett Gardner, MD; Kramer, MD; Marshall, MD; Netherton, MD; Poleto, MD; Skinner, MD o [address] [place], [place] [address] o [phone] o Mon-Fri 8:30-5:30, Sat (Oct.-Mar.) 9:00-1:00 o Babies seen by providers at [facility] o Accepting Medicaid  [place] ([address]) . [facility] o Reid, MD; Warner, MD o [address], , [place] [address] o [phone] o Mon-Fri 8:30-5:00, Sat 8:30-12:00 o Providers come to see babies at [facility] o Does NOT accept Medicaid . [facility] o Becker, PA; Hagler, MD; Scifres, PA; Sun, MD; Swayne, MD o [address],  [place], [place] [address] o [phone] o Mon-Fri 8:00-5:00 o Babies seen by providers at [facility] o Does NOT accept Medicaid o Only accepting babies of parents who are patients o Please call early in hospitalization for appointment (limited availability) . [facility] o Clark, MD; Frye, MD; Kelleher, MD; Mack, NP; Miller, MD; O'Keller, MD; Patterson, NP; Pudlo, MD; Puzio, MD; Thomas, MD; Tucker, MD; Twiselton, MD o [address], [place], [place] [address] o [phone] o Mon-Fri 8:00-5:00, Sat 9:00-12:00 o Providers come to see babies at [facility] o Does NOT accept Medicaid  [place] ([address]) . [facility] o Limited providers accepting new patients: Brake, NP; Wharton, PA o [address], [place], [place] [address] o [phone] o Mon-Fri 8:00-5:00 o Babies seen by providers at [facility] o Does NOT accept Medicaid o Only accepting babies of parents who are patients o Please call early in hospitalization for appointment (limited availability) . [facility] o Gay, MD; Quinlan, MD o [address] [place], [place] [address] o [phone] (press 1 to schedule appointment) o Mon-Fri 8:00-5:00 o Providers come to see babies at [facility] o Does NOT accept Medicaid . [facility] o Mazer, MD o [address] [place], [place] [address] o [phone] o Mon-Fri 8:30-5:00 (lunch 12:30-1:00), extended hours by appointment only Wed 5:00-6:30 o Babies seen by [facility] providers o Accepting Medicaid . [facility] o Banks, MD; Jordan, MD; Koberlein, MD o [address], [place], [place] [address] o [phone] o Mon-Fri 8:00-5:00 o Babies seen by [facility] providers o Does NOT accept Medicaid . [facility] o Parker, MD; Hunter, MD; Wallace, DO o [address] [place], [place]  [address] o [phone] o Mon-Fri 8:00-5:00 o Babies seen by [facility] providers o Does NOT accept Medicaid . [facility] o Brandon, PA; Brecken, PA; Christy, NP; Dees, MD; DeClaire, MD; DeWeese, MD; Hansen, NP; Mills, NP; Parrish, NP; Smoot, NP; Summer, MD; Vapne, MD o [address] [place], [place] [address] o [phone] o Mon-Fri 8:30-5:00, Sat 10:00-1:00 o Providers come to see babies at [facility] o Does NOT accept Medicaid o Free prenatal information session Tuesdays at 4:45pm . [facility] o Bouska, MD; Gordon, PA; Jeffery, PA; Weber, PA o [address] [place] [address] o [phone] o Mon-Fri 7:30-5:30 o Babies seen by [facility] providers . [facility] o [address], [place], [place]  [address] o [phone]   Fax - [phone]  [place] ([address] & [address]) . [facility] o [place]ese, [place] o [address] [place], [place] [address] o [phone] o Mon-Thur 8:00-6:00 o Providers come to see babies at [facility] o Accepting Medicaid . [facility] o Anderson, NP; Badger, MD; Beal, PA; Spencer, PA o [address] [place],  [address] o [phone] o Mon-Thur 7:30-7:30, Fri 7:30-4:30 o Babies seen by [facility] providers o Accepting Medicaid . [facility] o Agbuya, MD; Klett, NP; Romgoolam, MD o [address], [place],  [address] o [phone] o Mon-Fri 8:30-5:00, Sat 8:30-12:00 o Providers come to see babies at [facility] o Accepting Medicaid o Must have "Meet & Greet" appointment at office prior to delivery . [facility] o McCord,   MD; Wallace, MD; Wood, MD o [address], [place], [place] [address] o [phone] o Mon-Wed 8:00-6:00, Thur-Fri 8:00-5:00, Sat 9:00-12:00 o Providers come to  see babies at [facility] o Does NOT accept Medicaid o Only accepting siblings of current patients . [facility]  o [address], [place], [place]  [address] o [phone]   Fax - [phone] . [facility] o [address], [place], [place]  [address] o [phone]   Fax - [phone]  [place] ([address] & [address]) . [facility] o Cirigliano, DO; Matthews, DO o [address] [place], [place] [address] o [phone] o Mon-Fri 7:00-5:00 o Babies seen by [facility] providers o Does NOT accept Medicaid . [facility] o Briscoe, MD; Howley, PA; Moreira, PA o [address], [place], [place] [address] o [phone] o Mon-Fri 8:00-5:00 o Babies seen by [facility] providers o Accepting Medicaid . [facility] o Boyd, MD; Church, PA; Jones, NP; Osborn, PA o [address], [place], [place] [address] o [phone] o Mon-Fri 8:00-5:00 o Babies seen by providers at [facility] o Accepting Medicaid  [place] ([address]) . [facility] [place] o Wendling, DO o [address] [place], [place] [address] o [phone] o Mon-Fri 8:00-5:00 o Babies seen by [facility] providers o Does NOT accept Medicaid o Limited availability, please call early in hospitalization to schedule follow-up . [facility] o Calderon, PA; Cummings, MD; Dillard, MD; Martin, PA; Olson, MD; VanDeven, PA o [address], [place], [place] [address] o [phone] o Mon-Fri 8:30-5:00, Sat 9:00-12:00 o Babies seen by providers at [facility] o Accepting Medicaid o Please register online then schedule online or call office o [email or web address] . [facility] o Hunter, NP; Kumar, MD; Martin Rogers, PA o [address], [place], [place] [address] o [phone] o Mon-Fri 8:00-5:00 o Babies seen by providers at [facility] o Accepting Medicaid . [facility] ([facility] o Sutter, MD; Kristi Fleenor, NP; West, MD o [address], [place], [place] [address] o [phone] o Mon-Fri 8:00-5:30, Sat&Sun by appointment (phones open at 8:30) o Babies seen by [facility] providers o Accepting Medicaid o Must be a first-time baby or sibling of current patient . [facility] [place]  o [address], [place], [place]  [address] o [phone]   Fax - [phone]  [place] ([address]) . [facility] o Brown, PA; Cowen, PA; Rice, MD; Helton, PA; Spry, MD o [address] [place], [place] [address] o [phone] o Mon-Thur 8:00-7:00, Fri 8:00-5:00, Sat 8:00-12:00, Sun 9:00-12:00 o Babies seen by [facility] providers o Accepting Medicaid . [facility] o Coe-Goins, MD; Marshall, MD; Pierre-Louis, MD o [address] [place] [address], [place], [place] [address] o [phone] o Mon-Thur 8:00-5:00 o Babies seen by providers at [facility] o Accepting Medicaid . [facility] o Bratton, MD; Coe-Goins, MD; Hayes, MD; Lewis, MD; List, MD; Lott, MD; Marshall, MD; Moran, MD; O'Neal, MD; Pierre-Louis, MD; Pitonzo, MD; Scholer, MD; Spangle, MD o [address] [place], [place]   [address] o [phone] o Mon-Fri 8:00-5:30, Sat (Oct.-Mar.) 9:00-1:00 o Babies seen by providers at [facility] o Accepting Medicaid o Must fill out new patient packet, available online at [email or web address] . [facility] ([facility] o Friddle, NP; Harris, NP; Kelly, NP; Logan, MD;  Melvin, PA; Poth, MD; Ramadoss, MD; Stanton, NP o [address], [place], [place] [address] o [phone] o Mon-Thur 8:00-5:30, Fri 8:00-5:00 o Babies seen by providers at [facility] o Accepting Medicaid  [place] ([address]) . [facility] o Dixon, PA; [place], MD; Pickard, MD; Tapia, PA o [address], [place], [place] [address] o [phone] o Mon-Fri 8:00-5:00 o Babies seen by providers at [facility] o Accepting Medicaid   [place] ([address]) . [facility] [place] o Masneri, DO; Meyers, MD; Nelson, PA o [address], [place], [place] [address] o [phone] o Mon-Fri 8:00-5:00 o Babies seen by providers at [facility] o Does NOT accept Medicaid o Limited appointment availability, please call early in hospitalization  . [facility] [place] o Kunedd, DO; McGowen, MD o [address], [place], [place] [address] o [phone] o Mon-Fri 8:00-5:00 o Babies seen by [facility] providers o Does NOT accept Medicaid . [facility] [place] o Cameron, MD; MacDonald, MD; Michaels, PA; Nayak, MD o [address] [place] [address], [place], [place] [address] o [phone] o Mon-Fri 8:00-5:00 o After hours clinic ([address] [place], [place] [address]) [phone] Mon-Fri 5:00-8:00, Sat 12:00-6:00, Sun 10:00-4:00 o Babies seen by [facility] providers o Accepting Medicaid . [facility] [place] o [address], [place], [place]  [address] o [phone]   Fax - [phone]  [place] ([address]) . [facility] [place] o Andy, MD o [address], [place], [place] [address] o [phone] o Mon-Fri 8:00-5:00 o Babies seen by [facility] providers o Does NOT accept Medicaid . [facility] [place] ([facility] [place]) o Eksir, MD o [address], [place], [place]  [address] o [phone] o Mon-Thur 8:00-7:00, Fri 8:00-5:00, Sat 8:00-12:00 o Babies seen by providers at [facility] o Accepting Medicaid - but does not have vaccinations in office (must be received elsewhere) o Limited availability, please call early in hospitalization  [place] ([address]) . [facility]  o Charlene Flemming, MD o [address], [place] [address] o [phone]  Fax [phone]   

## 2020-06-19 ENCOUNTER — Encounter (HOSPITAL_COMMUNITY)
Admission: EM | Disposition: A | Discharge status: Home / Self Care | Payer: Self-pay | Source: Home / Self Care | Attending: Family Medicine

## 2020-06-19 ENCOUNTER — Encounter (HOSPITAL_COMMUNITY): Payer: Self-pay | Admitting: Obstetrics and Gynecology

## 2020-06-19 ENCOUNTER — Inpatient Hospital Stay (HOSPITAL_COMMUNITY): Payer: Medicare PPO | Admitting: Anesthesiology

## 2020-06-19 ENCOUNTER — Inpatient Hospital Stay (HOSPITAL_COMMUNITY)
Admission: EM | Admit: 2020-06-19 | Discharge: 2020-06-23 | DRG: 784 | Disposition: A | Discharge status: Home / Self Care | Payer: Medicare PPO | Attending: Family Medicine | Admitting: Family Medicine

## 2020-06-19 DIAGNOSIS — N322 Vesical fistula, not elsewhere classified: Secondary | ICD-10-CM

## 2020-06-19 DIAGNOSIS — O99324 Drug use complicating childbirth: Secondary | ICD-10-CM | POA: Diagnosis present

## 2020-06-19 DIAGNOSIS — Z3A38 38 weeks gestation of pregnancy: Secondary | ICD-10-CM | POA: Diagnosis not present

## 2020-06-19 DIAGNOSIS — O99334 Smoking (tobacco) complicating childbirth: Secondary | ICD-10-CM | POA: Diagnosis present

## 2020-06-19 DIAGNOSIS — F17210 Nicotine dependence, cigarettes, uncomplicated: Secondary | ICD-10-CM | POA: Diagnosis present

## 2020-06-19 DIAGNOSIS — F4321 Adjustment disorder with depressed mood: Secondary | ICD-10-CM | POA: Diagnosis not present

## 2020-06-19 DIAGNOSIS — Z20822 Contact with and (suspected) exposure to covid-19: Secondary | ICD-10-CM | POA: Diagnosis present

## 2020-06-19 DIAGNOSIS — O9902 Anemia complicating childbirth: Secondary | ICD-10-CM | POA: Diagnosis present

## 2020-06-19 DIAGNOSIS — O34211 Maternal care for low transverse scar from previous cesarean delivery: Principal | ICD-10-CM | POA: Diagnosis present

## 2020-06-19 DIAGNOSIS — R45851 Suicidal ideations: Secondary | ICD-10-CM | POA: Diagnosis present

## 2020-06-19 DIAGNOSIS — O26893 Other specified pregnancy related conditions, third trimester: Secondary | ICD-10-CM | POA: Diagnosis present

## 2020-06-19 DIAGNOSIS — O99344 Other mental disorders complicating childbirth: Secondary | ICD-10-CM | POA: Diagnosis present

## 2020-06-19 DIAGNOSIS — F319 Bipolar disorder, unspecified: Secondary | ICD-10-CM | POA: Diagnosis present

## 2020-06-19 DIAGNOSIS — Z302 Encounter for sterilization: Secondary | ICD-10-CM

## 2020-06-19 DIAGNOSIS — F419 Anxiety disorder, unspecified: Secondary | ICD-10-CM | POA: Diagnosis present

## 2020-06-19 DIAGNOSIS — D649 Anemia, unspecified: Secondary | ICD-10-CM | POA: Diagnosis present

## 2020-06-19 DIAGNOSIS — O1002 Pre-existing essential hypertension complicating childbirth: Secondary | ICD-10-CM | POA: Diagnosis present

## 2020-06-19 DIAGNOSIS — F1290 Cannabis use, unspecified, uncomplicated: Secondary | ICD-10-CM | POA: Diagnosis present

## 2020-06-19 LAB — GC/CHLAMYDIA PROBE AMP (~~LOC~~) NOT AT ARMC
Chlamydia: NEGATIVE
Comment: NEGATIVE
Comment: NORMAL
Neisseria Gonorrhea: NEGATIVE

## 2020-06-19 LAB — CBC
HCT: 36.4 % (ref 36.0–46.0)
Hemoglobin: 11 g/dL — ABNORMAL LOW (ref 12.0–15.0)
MCH: 24.9 pg — ABNORMAL LOW (ref 26.0–34.0)
MCHC: 30.2 g/dL (ref 30.0–36.0)
MCV: 82.4 fL (ref 80.0–100.0)
Platelets: 292 10*3/uL (ref 150–400)
RBC: 4.42 MIL/uL (ref 3.87–5.11)
RDW: 17.9 % — ABNORMAL HIGH (ref 11.5–15.5)
WBC: 10.3 10*3/uL (ref 4.0–10.5)
nRBC: 0.2 % (ref 0.0–0.2)

## 2020-06-19 LAB — SARS CORONAVIRUS 2 BY RT PCR (HOSPITAL ORDER, PERFORMED IN ~~LOC~~ HOSPITAL LAB): SARS Coronavirus 2: NEGATIVE

## 2020-06-19 SURGERY — Surgical Case
Anesthesia: General | Laterality: Bilateral

## 2020-06-19 MED ORDER — OXYTOCIN-SODIUM CHLORIDE 30-0.9 UT/500ML-% IV SOLN
INTRAVENOUS | Status: AC
  Filled 2020-06-19: qty 500

## 2020-06-19 MED ORDER — DEXAMETHASONE SODIUM PHOSPHATE 4 MG/ML IJ SOLN
INTRAMUSCULAR | Status: AC
  Filled 2020-06-19: qty 1

## 2020-06-19 MED ORDER — FENTANYL CITRATE (PF) 250 MCG/5ML IJ SOLN: INTRAMUSCULAR | Status: DC | PRN

## 2020-06-19 MED ORDER — ONDANSETRON HCL 4 MG/2ML IJ SOLN
INTRAMUSCULAR | Status: AC
  Filled 2020-06-19: qty 2

## 2020-06-19 MED ORDER — SOD CITRATE-CITRIC ACID 500-334 MG/5ML PO SOLN: 30.0000 mL | Freq: Once | ORAL | Status: DC

## 2020-06-19 MED ORDER — SIMETHICONE 80 MG PO CHEW
80.0000 mg | CHEWABLE_TABLET | Freq: Three times a day (TID) | ORAL | Status: DC
  Administered 2020-06-20 – 2020-06-23 (×8): 80 mg via ORAL
  Filled 2020-06-19 (×8): qty 1

## 2020-06-19 MED ORDER — SUCCINYLCHOLINE CHLORIDE 20 MG/ML IJ SOLN
INTRAMUSCULAR | Status: DC | PRN
  Administered 2020-06-19: 120 mg via INTRAVENOUS

## 2020-06-19 MED ORDER — PHENYLEPHRINE 40 MCG/ML (10ML) SYRINGE FOR IV PUSH (FOR BLOOD PRESSURE SUPPORT)
PREFILLED_SYRINGE | INTRAVENOUS | Status: AC
  Filled 2020-06-19: qty 20

## 2020-06-19 MED ORDER — ALBUMIN HUMAN 5 % IV SOLN: INTRAVENOUS | Status: DC | PRN

## 2020-06-19 MED ORDER — MEASLES, MUMPS & RUBELLA VAC IJ SOLR: 0.5000 mL | Freq: Once | INTRAMUSCULAR | Status: DC

## 2020-06-19 MED ORDER — LACTATED RINGERS IV BOLUS
1000.0000 mL | Freq: Once | INTRAVENOUS | Status: AC
  Administered 2020-06-19: 1000 mL via INTRAVENOUS

## 2020-06-19 MED ORDER — SUCCINYLCHOLINE CHLORIDE 200 MG/10ML IV SOSY
PREFILLED_SYRINGE | INTRAVENOUS | Status: AC
  Filled 2020-06-19: qty 10

## 2020-06-19 MED ORDER — OXYTOCIN-SODIUM CHLORIDE 30-0.9 UT/500ML-% IV SOLN
2.5000 [IU]/h | INTRAVENOUS | Status: AC
  Administered 2020-06-19: 2.5 [IU]/h via INTRAVENOUS
  Filled 2020-06-19: qty 500

## 2020-06-19 MED ORDER — ONDANSETRON HCL 4 MG/2ML IJ SOLN
INTRAMUSCULAR | Status: DC | PRN
  Administered 2020-06-19: 4 mg via INTRAVENOUS

## 2020-06-19 MED ORDER — TETANUS-DIPHTH-ACELL PERTUSSIS 5-2.5-18.5 LF-MCG/0.5 IM SUSY: 0.5000 mL | PREFILLED_SYRINGE | Freq: Once | INTRAMUSCULAR | Status: DC

## 2020-06-19 MED ORDER — DIPHENHYDRAMINE HCL 25 MG PO CAPS
25.0000 mg | ORAL_CAPSULE | Freq: Four times a day (QID) | ORAL | Status: DC | PRN
Start: 2020-06-19 — End: 2020-06-23

## 2020-06-19 MED ORDER — HYDROMORPHONE HCL 1 MG/ML IJ SOLN
INTRAMUSCULAR | Status: AC
  Filled 2020-06-19: qty 1

## 2020-06-19 MED ORDER — PROMETHAZINE HCL 25 MG/ML IJ SOLN: 6.2500 mg | INTRAMUSCULAR | Status: DC | PRN

## 2020-06-19 MED ORDER — CEFAZOLIN SODIUM-DEXTROSE 2-4 GM/100ML-% IV SOLN
2.0000 g | INTRAVENOUS | Status: AC
  Administered 2020-06-19: 2 g via INTRAVENOUS

## 2020-06-19 MED ORDER — WITCH HAZEL-GLYCERIN EX PADS: 1.0000 "application " | MEDICATED_PAD | CUTANEOUS | Status: DC | PRN

## 2020-06-19 MED ORDER — HYDROMORPHONE HCL 1 MG/ML IJ SOLN
INTRAMUSCULAR | Status: DC | PRN
  Administered 2020-06-19: 1 mg via INTRAVENOUS

## 2020-06-19 MED ORDER — FAMOTIDINE IN NACL 20-0.9 MG/50ML-% IV SOLN: 20.0000 mg | Freq: Once | INTRAVENOUS | Status: DC

## 2020-06-19 MED ORDER — ZOLPIDEM TARTRATE 5 MG PO TABS: 5.0000 mg | ORAL_TABLET | Freq: Every evening | ORAL | Status: DC | PRN

## 2020-06-19 MED ORDER — FENTANYL CITRATE (PF) 100 MCG/2ML IJ SOLN
INTRAMUSCULAR | Status: DC | PRN
  Administered 2020-06-19: 150 ug via INTRAVENOUS
  Administered 2020-06-19 (×2): 100 ug via INTRAVENOUS
  Administered 2020-06-19: 150 ug via INTRAVENOUS

## 2020-06-19 MED ORDER — KETOROLAC TROMETHAMINE 30 MG/ML IJ SOLN
30.0000 mg | Freq: Four times a day (QID) | INTRAMUSCULAR | Status: AC
  Administered 2020-06-19 – 2020-06-20 (×3): 30 mg via INTRAVENOUS
  Filled 2020-06-19 (×3): qty 1

## 2020-06-19 MED ORDER — DIBUCAINE (PERIANAL) 1 % EX OINT: 1.0000 "application " | TOPICAL_OINTMENT | CUTANEOUS | Status: DC | PRN

## 2020-06-19 MED ORDER — FENTANYL CITRATE (PF) 250 MCG/5ML IJ SOLN
INTRAMUSCULAR | Status: AC
  Filled 2020-06-19: qty 5

## 2020-06-19 MED ORDER — OXYTOCIN-SODIUM CHLORIDE 30-0.9 UT/500ML-% IV SOLN
INTRAVENOUS | Status: DC | PRN
  Administered 2020-06-19: 450 mL via INTRAVENOUS

## 2020-06-19 MED ORDER — LACTATED RINGERS IV SOLN: INTRAVENOUS | Status: DC

## 2020-06-19 MED ORDER — PROPOFOL 10 MG/ML IV BOLUS
INTRAVENOUS | Status: DC | PRN
  Administered 2020-06-19: 200 mg via INTRAVENOUS

## 2020-06-19 MED ORDER — PROPOFOL 10 MG/ML IV BOLUS
INTRAVENOUS | Status: AC
  Filled 2020-06-19: qty 20

## 2020-06-19 MED ORDER — SENNOSIDES-DOCUSATE SODIUM 8.6-50 MG PO TABS
2.0000 | ORAL_TABLET | ORAL | Status: DC
  Administered 2020-06-20 – 2020-06-23 (×4): 2 via ORAL
  Filled 2020-06-19 (×4): qty 2

## 2020-06-19 MED ORDER — LIDOCAINE HCL (CARDIAC) PF 100 MG/5ML IV SOSY
PREFILLED_SYRINGE | INTRAVENOUS | Status: DC | PRN
  Administered 2020-06-19: 60 mg via INTRAVENOUS

## 2020-06-19 MED ORDER — ACETAMINOPHEN 500 MG PO TABS
1000.0000 mg | ORAL_TABLET | Freq: Four times a day (QID) | ORAL | Status: DC
  Administered 2020-06-19 – 2020-06-23 (×14): 1000 mg via ORAL
  Filled 2020-06-19 (×15): qty 2

## 2020-06-19 MED ORDER — IBUPROFEN 800 MG PO TABS
800.0000 mg | ORAL_TABLET | Freq: Four times a day (QID) | ORAL | Status: DC
  Administered 2020-06-20 – 2020-06-23 (×12): 800 mg via ORAL
  Filled 2020-06-19 (×11): qty 1
  Filled 2020-06-19: qty 4
  Filled 2020-06-19: qty 1

## 2020-06-19 MED ORDER — COCONUT OIL OIL: 1.0000 "application " | TOPICAL_OIL | Status: DC | PRN

## 2020-06-19 MED ORDER — HYDROMORPHONE HCL 1 MG/ML IJ SOLN: 0.2500 mg | INTRAMUSCULAR | Status: DC | PRN

## 2020-06-19 MED ORDER — ENOXAPARIN SODIUM 40 MG/0.4ML ~~LOC~~ SOLN
40.0000 mg | SUBCUTANEOUS | Status: DC
  Administered 2020-06-20 – 2020-06-23 (×4): 40 mg via SUBCUTANEOUS
  Filled 2020-06-19 (×4): qty 0.4

## 2020-06-19 MED ORDER — KETOROLAC TROMETHAMINE 30 MG/ML IJ SOLN
30.0000 mg | Freq: Once | INTRAMUSCULAR | Status: AC | PRN
  Administered 2020-06-19: 30 mg via INTRAVENOUS

## 2020-06-19 MED ORDER — SODIUM CHLORIDE 0.9 % IV SOLN
INTRAVENOUS | Status: DC | PRN
  Administered 2020-06-19: 500 mg via INTRAVENOUS

## 2020-06-19 MED ORDER — METOCLOPRAMIDE HCL 5 MG/ML IJ SOLN
INTRAMUSCULAR | Status: DC | PRN
  Administered 2020-06-19: 10 mg via INTRAVENOUS

## 2020-06-19 MED ORDER — SODIUM CHLORIDE 0.9 % IV SOLN
INTRAVENOUS | Status: AC
  Filled 2020-06-19: qty 500

## 2020-06-19 MED ORDER — DEXAMETHASONE SODIUM PHOSPHATE 10 MG/ML IJ SOLN
INTRAMUSCULAR | Status: DC | PRN
  Administered 2020-06-19: 4 mg via INTRAVENOUS

## 2020-06-19 MED ORDER — MEPERIDINE HCL 25 MG/ML IJ SOLN
6.2500 mg | INTRAMUSCULAR | Status: DC | PRN
Start: 2020-06-19 — End: 2020-06-19

## 2020-06-19 MED ORDER — MENTHOL 3 MG MT LOZG: 1.0000 | LOZENGE | OROMUCOSAL | Status: DC | PRN

## 2020-06-19 MED ORDER — OXYCODONE HCL 5 MG PO TABS
5.0000 mg | ORAL_TABLET | ORAL | Status: DC | PRN
  Administered 2020-06-20 – 2020-06-22 (×4): 5 mg via ORAL
  Filled 2020-06-19 (×4): qty 1

## 2020-06-19 MED ORDER — SIMETHICONE 80 MG PO CHEW: 80.0000 mg | CHEWABLE_TABLET | ORAL | Status: DC | PRN

## 2020-06-19 MED ORDER — LIDOCAINE HCL (PF) 2 % IJ SOLN
INTRAMUSCULAR | Status: AC
  Filled 2020-06-19: qty 5

## 2020-06-19 MED ORDER — OXYCODONE HCL 5 MG/5ML PO SOLN: 5.0000 mg | Freq: Once | ORAL | Status: DC | PRN

## 2020-06-19 MED ORDER — PHENYLEPHRINE HCL (PRESSORS) 10 MG/ML IV SOLN
INTRAVENOUS | Status: DC | PRN
  Administered 2020-06-19: 80 ug via INTRAVENOUS
  Administered 2020-06-19: 120 ug via INTRAVENOUS
  Administered 2020-06-19: 80 ug via INTRAVENOUS

## 2020-06-19 MED ORDER — AMISULPRIDE (ANTIEMETIC) 5 MG/2ML IV SOLN: 10.0000 mg | Freq: Once | INTRAVENOUS | Status: DC | PRN

## 2020-06-19 MED ORDER — KETOROLAC TROMETHAMINE 30 MG/ML IJ SOLN
INTRAMUSCULAR | Status: AC
  Filled 2020-06-19: qty 1

## 2020-06-19 MED ORDER — OXYCODONE HCL 5 MG PO TABS: 5.0000 mg | ORAL_TABLET | Freq: Once | ORAL | Status: DC | PRN

## 2020-06-19 MED ORDER — PRENATAL MULTIVITAMIN CH
1.0000 | ORAL_TABLET | Freq: Every day | ORAL | Status: DC
  Administered 2020-06-20 – 2020-06-23 (×4): 1 via ORAL
  Filled 2020-06-19 (×4): qty 1

## 2020-06-19 MED ORDER — LACTATED RINGERS IV SOLN: INTRAVENOUS | Status: DC | PRN

## 2020-06-19 SURGICAL SUPPLY — 36 items
BENZOIN TINCTURE PRP APPL 2/3 (GAUZE/BANDAGES/DRESSINGS) ×2 IMPLANT
CATH ROBINSON RED A/P 16FR (CATHETERS) IMPLANT
CHLORAPREP W/TINT 26ML (MISCELLANEOUS) ×2 IMPLANT
CLAMP CORD UMBIL (MISCELLANEOUS) IMPLANT
CLOTH BEACON ORANGE TIMEOUT ST (SAFETY) ×2 IMPLANT
DRSG OPSITE POSTOP 4X10 (GAUZE/BANDAGES/DRESSINGS) ×2 IMPLANT
ELECT REM PT RETURN 9FT ADLT (ELECTROSURGICAL) ×2
ELECTRODE REM PT RTRN 9FT ADLT (ELECTROSURGICAL) ×1 IMPLANT
EXTRACTOR VACUUM M CUP 4 TUBE (SUCTIONS) IMPLANT
GLOVE BIOGEL PI IND STRL 7.0 (GLOVE) ×1 IMPLANT
GLOVE BIOGEL PI IND STRL 7.5 (GLOVE) ×1 IMPLANT
GLOVE BIOGEL PI INDICATOR 7.0 (GLOVE) ×1
GLOVE BIOGEL PI INDICATOR 7.5 (GLOVE) ×1
GLOVE ECLIPSE 7.5 STRL STRAW (GLOVE) ×2 IMPLANT
GOWN STRL REUS W/TWL LRG LVL3 (GOWN DISPOSABLE) ×6 IMPLANT
HEMOSTAT ARISTA ABSORB 3G PWDR (HEMOSTASIS) ×2 IMPLANT
KIT ABG SYR 3ML LUER SLIP (SYRINGE) IMPLANT
NEEDLE HYPO 25X5/8 SAFETYGLIDE (NEEDLE) IMPLANT
NS IRRIG 1000ML POUR BTL (IV SOLUTION) ×2 IMPLANT
PACK C SECTION WH (CUSTOM PROCEDURE TRAY) ×2 IMPLANT
PAD OB MATERNITY 4.3X12.25 (PERSONAL CARE ITEMS) ×2 IMPLANT
PENCIL SMOKE EVAC W/HOLSTER (ELECTROSURGICAL) ×2 IMPLANT
RTRCTR C-SECT PINK 25CM LRG (MISCELLANEOUS) ×2 IMPLANT
STRIP CLOSURE SKIN 1/2X4 (GAUZE/BANDAGES/DRESSINGS) ×2 IMPLANT
STRIP SURGICAL 1/4 X 6 IN (GAUZE/BANDAGES/DRESSINGS) ×2 IMPLANT
SUT PLAIN 2 0 (SUTURE) ×1
SUT PLAIN ABS 2-0 54XMFL TIE (SUTURE) ×1 IMPLANT
SUT PROLENE 1 CT (SUTURE) ×2 IMPLANT
SUT VIC AB 0 CTX 36 (SUTURE) ×5
SUT VIC AB 0 CTX36XBRD ANBCTRL (SUTURE) ×5 IMPLANT
SUT VIC AB 2-0 CT1 27 (SUTURE) ×1
SUT VIC AB 2-0 CT1 TAPERPNT 27 (SUTURE) ×1 IMPLANT
SUT VIC AB 4-0 KS 27 (SUTURE) ×2 IMPLANT
TOWEL OR 17X24 6PK STRL BLUE (TOWEL DISPOSABLE) ×2 IMPLANT
TRAY FOLEY W/BAG SLVR 14FR LF (SET/KITS/TRAYS/PACK) ×2 IMPLANT
WATER STERILE IRR 1000ML POUR (IV SOLUTION) ×2 IMPLANT

## 2020-06-19 NOTE — Transfer of Care (Signed)
Immediate Anesthesia Transfer of Care Note  Patient: Multimedia programmer  Procedure(s) Performed: CESAREAN SECTION WITH BILATERAL TUBAL LIGATION (Bilateral )  Patient Location: PACU  Anesthesia Type:General  Level of Consciousness: awake, alert  and oriented  Airway & Oxygen Therapy: Patient Spontanous Breathing and Patient connected to nasal cannula oxygen  Post-op Assessment: Report given to RN and Post -op Vital signs reviewed and stable  Post vital signs: Reviewed and stable  Last Vitals:  Vitals Value Taken Time  BP 109/67 06/19/20 1701  Temp 36.7 C 06/19/20 1700  Pulse 93 06/19/20 1714  Resp 16 06/19/20 1714  SpO2 96 % 06/19/20 1714  Vitals shown include unvalidated device data.  Last Pain:  Vitals:   06/19/20 1700  PainSc: 3       Patients Stated Pain Goal: 0 (06/19/20 1700)  Complications: No complications documented.

## 2020-06-19 NOTE — Op Note (Signed)
Operative Note   SURGERY DATE: 06/19/2020  PRE-OP DIAGNOSIS:  *Pregnancy at [redacted]w[redacted]d  * Undesired fertility * history of one prior c section   POST-OP DIAGNOSIS: Same   PROCEDURE: repeat low transverse cesarean section via pfannenstiel skin incision with double layer uterine closure, right salpingectomy   SURGEON: Surgeon(s) and Role:    * Levie Heritage, DO - Primary    * Myna Hidalgo, DO - Assisting    * Shahab Polhamus, Arlana Pouch, MD - Assisting   An experienced assistant was required given the standard of surgical care given the complexity of the case.  This assistant was needed for exposure, dissection, suctioning, retraction, instrument exchange, assisting with delivery with administration of fundal pressure, and for overall help during the procedure.   ANESTHESIA: general  ESTIMATED BLOOD LOSS: 471  DRAINS: 50 mL UOP via indwelling foley  TOTAL IV FLUIDS: 2000 mL crystalloid  VTE PROPHYLAXIS: SCDs to bilateral lower extremities  ANTIBIOTICS: Two grams of Cefazolin were given., within 1 hour of skin incision  SPECIMENS: placenta, right fallopian tube   COMPLICATIONS: none  INDICATIONS: desires repeat c section, desires infertility   FINDINGS: Vesicocutaneous fistula noted with adhesions surround area. Left ovary and fallopian tube adhered to omentum and blad der. Normal right ovary and tube. Grossly normal uterus. Clear amniotic fluid, cephalic  female infant, weight  pendinggm, APGARs 9/9, intact placenta. Cord gas ph 7.123, pCO2 68.6, Bicarb 21.3.   PROCEDURE IN DETAIL: The patient was taken to the operating room. Fetal heart tones were noted to be in the 50's and decision made to proceed with stat c section. She was placed under general anesthesia. She was then prepped and draped in the normal fashion in the dorsal supine position with a leftward tilt.  After a time out was performed, a pfannensteil skin incision was made with the scalpel and carried through to the underlying  layer of fascia. The fascia was then incised at the midline and this incision was extended laterally bluntly. The rectus muscles were then separated in the midline and the peritoneum was entered bluntly. The bladder blade was inserted.    A low transverse hysterotomy was made with the scalpel until the endometrial cavity was breached and the amniotic sac ruptured yielding clear amniotic fluid. This incision was extended bluntly and the infant's head, shoulders and body were delivered atraumatically.The cord was clamped x 2 and cut, and the infant was handed to the awaiting pediatricians, after delayed cord clamping was done. A cord gas was obtained (see above). The placenta was then gradually expressed from the uterus and then the uterus was cleared of all clots and debris. A cervical extension was noted and repaired with 1-0 Vicryl.The hysterotomy was then repaired with a running suture of 1-0 vicryl. A second imbricating layer of 1-0 vicryl suture was then placed. Several figure-of-eight sutures of 1-0 vicryl were added to achieve excellent hemostasis. Mayme Genta was placed over top the incision.    Attention was then turned to the right fallopian tube. An avascular segment of the mesosalpinx was identified and a space was made with Bove. The tube was grasped with two kelly's and tube was cut and then ligated with 2-0 plain gut suture in two segments. Excellent hemostasis noted. Attention was subsequently turned to the left side. At this time it was noted that the left fallopian tube and ovary were encased in dense bladder and omental adhesions. We were unable to adequately visualize fimbriae and so ligation was not completed.  The hysterotomy and all operative sites were reinspected and excellent hemostasis was noted.  The peritoneum was closed with a running stitch of 3-0 Vicryl. The fascia was reapproximated with 0 Vicryl in a simple running fashion bilaterally. The subcutaneous layer was then  reapproximated with interrupted sutures of 2-0 plain gut, and the skin was then closed with 4-0 vicryl, in a subcuticular fashion.  The patient  tolerated the procedure well. Sponge, lap, needle, and instrument counts were correct x 2. The patient was transferred to the recovery room awake, alert and breathing independently in stable condition.   Casper Harrison, MD [facility] Family Medicine Fellow, [facility] for [facility], [facility]

## 2020-06-19 NOTE — Progress Notes (Signed)
Pt brought urgently to OB OR C for c-section. This RN met Dr. Nehemiah Settle at [place] door to confirm urgency of case. Dr Nehemiah Settle stated that case was not code cesarean. RN called PBX operator to place delivery call per protocol for all cesarean deliveries.

## 2020-06-19 NOTE — Anesthesia Preprocedure Evaluation (Addendum)
Anesthesia Evaluation  Patient identified by MRN, date of birth, ID band Patient awake    Reviewed: Allergy & Precautions, H&P , NPO status , Patient's Chart, lab work & pertinent test results  Airway Mallampati: II  TM Distance: >3 FB Neck ROM: Full    Dental no notable dental hx.    Pulmonary neg pulmonary ROS, Current Smoker and Patient abstained from smoking.,    Pulmonary exam normal breath sounds clear to auscultation       Cardiovascular hypertension, negative cardio ROS Normal cardiovascular exam Rhythm:Regular Rate:Normal     Neuro/Psych Anxiety Depression Bipolar Disorder negative neurological ROS  negative psych ROS   GI/Hepatic negative GI ROS, Neg liver ROS,   Endo/Other  negative endocrine ROS  Renal/GU negative Renal ROS  negative genitourinary   Musculoskeletal negative musculoskeletal ROS (+)   Abdominal   Peds negative pediatric ROS (+)  Hematology negative hematology ROS (+)   Anesthesia Other Findings   Reproductive/Obstetrics (+) Pregnancy                             Anesthesia Physical Anesthesia Plan  ASA: II and emergent  Anesthesia Plan: General   Post-op Pain Management:    Induction: Intravenous, Rapid sequence and Cricoid pressure planned  PONV Risk Score and Plan: 2 and Ondansetron, Midazolam and Treatment may vary due to age or medical condition  Airway Management Planned: Oral ETT  Additional Equipment:   Intra-op Plan:   Post-operative Plan: Extubation in OR  Informed Consent: I have reviewed the patients History and Physical, chart, labs and discussed the procedure including the risks, benefits and alternatives for the proposed anesthesia with the patient or authorized representative who has indicated his/her understanding and acceptance.     Dental advisory given  Plan Discussed with: CRNA  Anesthesia Plan Comments:          Anesthesia Quick Evaluation

## 2020-06-19 NOTE — H&P (Signed)
Obstetric Preoperative History and Physical  Michelle [name] is a 25 y.o. [id number] with IUP at 92w3dwho presented to MAU in active labor. Found to be 8 cm in MAU.  Reports good fetal movement, no bleeding. No acute preoperative concerns. Patient strongly desires repeat c section.  Cesarean Section Indication: patient declines vag del attempt  Prenatal Course Source of Care: [facility]    Pregnancy complications or risks: Patient Active Problem List   Diagnosis Date Noted  . Cesarean delivery delivered 06/19/2020  . Previous cesarean delivery, antepartum 06/18/2020  . Elevated blood pressure affecting pregnancy in third trimester, antepartum 05/21/2020  . Anemia affecting pregnancy in third trimester 05/21/2020  . 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 Supervision of high risk pregnancy, antepartum 04/12/2020  . ASCUS of cervix with negative high risk HPV 04/12/2020  . Bipolar disorder ([place]   . Anxiety   . Depression   . Late prenatal care affecting pregnancy, antepartum    She plans to breastfeed She desires bilateral tubal ligation for postpartum contraception.   Prenatal labs and studies: ABO, Rh: --/--/O POS (02/22 1518) Antibody: NEG (02/22 1518) Rubella: 3.96 (12/30 1042) RPR: Non Reactive (12/30 1042)  HBsAg: Negative (12/30 1042)  HIV: Non Reactive (12/30 1042)  GBS:  1 hr Glucola  normal Genetic screening normal Anatomy UKoreanormal  Prenatal Transfer Tool  Maternal Diabetes: No Genetic Screening: Normal Maternal Ultrasounds/Referrals: Normal Fetal Ultrasounds or other Referrals:  None Maternal Substance Abuse:  No Significant Maternal Medications:  None Significant Maternal Lab Results: None  Past Medical History:  Diagnosis Date  . Anxiety   . Bipolar disorder ([place]   . Depression   . Hypertension   . Vaginal Pap smear, abnormal     Past Surgical History:  Procedure Laterality Date  . CESAREAN SECTION    . CYSTOSCOPY    . SUPRAPUBIC CATHETER INSERTION       OB History  Gravida Para Term Preterm AB Living  3 2 2  0 1 2  SAB IAB Ectopic Multiple Live Births  1 0 0 0 2    # Outcome Date GA Lbr Len/2nd Weight Sex Delivery Anes PTL Lv  3 Term 06/19/20 [id number] g M CS-LVertical Gen  LIV  2 SAB 03/28/17          1 Term 10/07/16 4033w0dM CS-LTranv  N LIV     Birth Comments: c/s due to nuchal cord/ fhr decels ; elevated blood pressure     Social History   Socioeconomic History  . Marital status: Married    Spouse name: Not on file  . Number of children: Not on file  . Years of education: Not on file  . Highest education level: Not on file  Occupational History  . Not on file  Tobacco Use  . Smoking status: Current Some Day Smoker    Packs/day: 0.50    Years: 5.00    Pack years: 2.50    Types: Cigarettes  . Smokeless tobacco: Never Used  Vaping Use  . Vaping Use: Never used  Substance and Sexual Activity  . Alcohol use: Never  . Drug use: Yes    Types: Marijuana, Amphetamines, Benzodiazepines    Comment: 5-6 months as of 05/2020  . Sexual activity: Yes    Birth control/protection: None  Other Topics Concern  . Not on file  Social History Narrative  . Not on file   Social Determinants of Health   Financial Resource Strain: Not on file  Food Insecurity: No Food Insecurity  . Worried About Charity fundraiser in the Last Year: Never true  . Ran Out of Food in the Last Year: Never true  Transportation Needs: Unmet Transportation Needs  . Lack of Transportation (Medical): Yes  . Lack of Transportation (Non-Medical): Yes  Physical Activity: Not on file  Stress: Not on file  Social Connections: Not on file    Family History  Adopted: Yes  Problem Relation Age of Onset  . Drug abuse Mother   . Drug abuse Father     Medications Prior to Admission  Medication Sig Dispense Refill Last Dose  . amphetamine-dextroamphetamine (ADDERALL) 5 MG tablet Take 5 mg by mouth daily.     . Blood Pressure Monitoring (BLOOD  PRESSURE KIT) DEVI 1 Device by Does not apply route as needed. 1 each 0   . Prenatal Vit-Fe Fumarate-FA (PREPLUS) 27-1 MG TABS Take 1 tablet by mouth daily. 30 tablet 11     Allergies  Allergen Reactions  . Lactose Intolerance (Gi) Other (See Comments)    Upset stomach    Review of Systems: Pertinent items noted in HPI and remainder of comprehensive ROS otherwise negative.  Physical Exam: BP 135/81 (BP Location: Left Arm)   Pulse 79   Temp 97.6 F (36.4 C)   Resp 18   LMP  (LMP Unknown)   SpO2 97%   Breastfeeding Unknown   CONSTITUTIONAL: Well-developed, well-nourished female in active labor  HENT:  Normocephalic, atraumatic,  SKIN: Skin is warm and dry. No rash noted. Not diaphoretic. No erythema. No pallor. [place]: Alert and oriented to person, place, and time.   RESPIRATORY: Effort normalno problems with respiration noted ABDOMEN: Soft, nontender, nondistended, gravid. Well-healed Pfannenstiel incision. PELVIC: Deferred    Pertinent Labs/Studies:   Results for orders placed or performed during the hospital encounter of 06/19/20 (from the past 72 hour(s))  CBC     Status: Abnormal   Collection Time: 06/19/20  3:18 PM  Result Value Ref Range   WBC 10.3 4.0 - 10.5 K/uL   RBC 4.42 3.87 - 5.11 MIL/uL   Hemoglobin 11.0 (L) 12.0 - 15.0 g/dL   HCT 36.4 36.0 - 46.0 %   MCV 82.4 80.0 - 100.0 fL   MCH 24.9 (L) 26.0 - 34.0 pg   MCHC 30.2 30.0 - 36.0 g/dL   RDW 17.9 (H) 11.5 - 15.5 %   Platelets 292 150 - 400 K/uL   nRBC 0.2 0.0 - 0.2 %    Comment: Performed at [facility], [place] [address] [place], [place] [address]  Type and screen [place]     Status: None   Collection Time: 06/19/20  3:18 PM  Result Value Ref Range   ABO/RH(D) O POS    Antibody Screen NEG    Sample Expiration      06/22/2020,2359 Performed at [facility], [place] [address] [place],  [address]   SARS Coronavirus 2 by RT PCR (hospital order, performed in [place] hospital lab)     Status: None   Collection Time: 06/19/20  3:52 PM  Result Value Ref Range   SARS Coronavirus 2 NEGATIVE NEGATIVE    Comment: (NOTE) SARS-CoV-2 target nucleic acids are NOT DETECTED.  The SARS-CoV-2 RNA is generally detectable in upper and lower respiratory specimens during the acute phase of infection. The lowest concentration of SARS-CoV-2 viral copies this assay can detect is 250 copies / mL. A negative result  does not preclude SARS-CoV-2 infection and should not be used as the sole basis for treatment or other patient management decisions.  A negative result may occur with improper specimen collection / handling, submission of specimen other than nasopharyngeal swab, presence of viral mutation(s) within the areas targeted by this assay, and inadequate number of viral copies (<250 copies / mL). A negative result must be combined with clinical observations, patient history, and epidemiological information.  Fact Sheet for Patients:   StrictlyIdeas.no  Fact Sheet for Healthcare Providers: BankingDealers.co.za  This test is not yet approved or  cleared by the [place] FDA and has been authorized for detection and/or diagnosis of SARS-CoV-2 by FDA under an Emergency Use Authorization (EUA).  This EUA will remain in effect (meaning this test can be used) for the duration of the COVID-19 declaration under Section 564(b)(1) of the Act, 21 U.S.C. section 360bbb-3(b)(1), unless the authorization is terminated or revoked sooner.  Performed at [facility], [place] [address], [place], [place] [address]     Assessment and Plan: Michelle [name] is a 25 y.o. [id number] at 29w3dbeing admitted for scheduled cesarean section. The risks of cesarean section discussed with the patient included but were not limited to: bleeding which may require transfusion or reoperation; infection which may require antibiotics; injury  to bowel, bladder, ureters or other surrounding organs; injury to the fetus; need for additional procedures including hysterectomy in the event of a life-threatening hemorrhage; placental abnormalities with subsequent pregnancies, incisional problems, thromboembolic phenomenon and other postoperative/anesthesia complications. The patient concurred with the proposed plan, giving informed written consent for the procedure.   Patient desires permanent sterilization.  Other reversible forms of contraception were discussed with patient; she declines all other modalities. Risks of procedure discussed with patient including but not limited to: risk of regret, permanence of method, bleeding, infection, injury to surrounding organs and need for additional procedures.  Failure risk of about 1% with increased risk of ectopic gestation if pregnancy occurs was also discussed with patient.  Also discussed possibility of post-tubal pain syndrome. Patient verbalized understanding of these risks and wants to proceed with sterilization.  Written informed consent obtained.  To OR when ready.   Pregnancy Complications:  -history of prior c section -vesicocutaneous fistula  -hx of depression, pp social work consult    JSharene Skeans MD [facility] HospitalFamily Medicine Fellow, FAurora Charter Oakfor [facility] [place]

## 2020-06-19 NOTE — Lactation Note (Signed)
This note was copied from a baby's chart. Lactation Consultation Note  Patient Name: Michelle [name] AJOIN'O Date: 06/19/2020 Reason for consult: Initial assessment;Infant < 6lbs;Early term 37-38.6wks Age:26 hours  Infant had one void since birth. LC did not observe latch at this time, infant breastfeed 1 hour prior to [facility] entering the room. Per mom, infant latches well at the breast, has breastfeed twice each feeding was 15 to 20 minutes in length. Mom's feeding plan is to continue to breastfeed infant according to cues, 8 to 12+ times, STS and afterwards she will supplement infant with Similac Neosure 22 kcal with iron at each feeding. Mom will use DEBP every 3 hours for 15 minutes on initial setting. Mom shown how to use DEBP & how to disassemble, clean, & reassemble parts. LC discussed with mom infant's input and output.  Mom made aware of O/P services, breastfeeding support groups, community resources, and our phone # for post-discharge questions.   Maternal Data Has patient been taught Hand Expression?: Yes Does the patient have breastfeeding experience prior to this delivery?: Yes How long did the patient breastfeed?: Per mom, she briefly breastfeed her 1st child in hospital only.  Feeding Mother's Current Feeding Choice: Breast Milk and Formula Nipple Type: Extra Slow Flow  LATCH Score                    Lactation Tools Discussed/Used Tools: Pump Breast pump type: Double-Electric Breast Pump Pump Education: Setup, frequency, and cleaning;Milk Storage Reason for Pumping: Help establish milk supply, infant less than 6 lbs and ETI infant. Pumping frequency: Mom will pump every 3 hours for 15 minutes on inital setting.  Interventions Interventions: Assisted with latch;Skin to skin;Hand express;Breast compression;Adjust position;Support pillows;Position options;Expressed milk;DEBP;Education  Discharge Pump: DEBP WIC Program: Yes  Consult Status       Danelle Earthly 06/19/2020, 11:42 PM

## 2020-06-19 NOTE — Anesthesia Procedure Notes (Addendum)
Procedure Name: Intubation Date/Time: 06/19/2020 3:36 PM Performed by: Graciela Husbands, CRNA Pre-anesthesia Checklist: Patient identified, Emergency Drugs available, Suction available and Patient being monitored Patient Re-evaluated:Patient Re-evaluated prior to induction Oxygen Delivery Method: Circle system utilized Preoxygenation: Pre-oxygenation with 100% oxygen Induction Type: Rapid sequence, Cricoid Pressure applied and IV induction Laryngoscope Size: Miller and 2 Grade View: Grade I Tube type: Oral Tube size: 7.0 mm Number of attempts: 1 Airway Equipment and Method: Stylet and Oral airway Placement Confirmation: ETT inserted through vocal cords under direct vision,  positive ETCO2 and breath sounds checked- equal and bilateral Secured at: 22 cm Dental Injury: Teeth and Oropharynx as per pre-operative assessment  Comments: Placed by [place], SRNA

## 2020-06-19 NOTE — MAU Provider Note (Signed)
  S [name] is a 25 y.o. [phone] patient who presents to MAU via EMS with chief complaint of painful recurrent contractions since 0800 today. Patient is planning repeat cesarean with BTL. Patient is screaming with contractions on arrival, intermittently grunting, stating she continues to desire cesarean. She endorses normal breakfast this morning.  O LMP  (LMP Unknown)   Breastfeeding Unknown    Physical Exam Vitals and nursing note reviewed. Exam conducted with a chaperone present.  Constitutional:      Appearance: Normal appearance.  Cardiovascular:     Rate and Rhythm: Normal rate.     Pulses: Normal pulses.  Pulmonary:     Effort: Pulmonary effort is normal.  Abdominal:     Comments: Gravid  Skin:    Capillary Refill: Capillary refill takes less than 2 seconds.  Neurological:     Mental Status: She is alert and oriented to person, place, and time.     A --Patient met off ambulance due to radio report of prolonged recurrent contractions, hx cesarean, vocalization on arrival --Patient 8/100/+1/Cephalic by CNM exam. No BBOW --Audible intermittent fetal brady --CNM attempted to confirm fetal brady with BSUS. Patient unable to maintain positioning to allow views of [place]. L&D team called, notified of arrival and labor status, desire for cesarean. Simultaneous ongoing patient repositioning and initiation of IV access by MAU staff. FHT ranging from 80s-110s. Dr. Berniece Andreas at bedside a short time later --Drs Elgie Congo and Nehemiah Settle at bedside shortly thereafter, patient rechecked by Dr. Nehemiah Settle, remains 8/100 and again states desire for repeat cesarean  P Patient moved to [place] in company with Drs Nehemiah Settle, Elgie Congo and Marcos Eke, [place], [place] 06/19/2020 4:03 PM

## 2020-06-19 NOTE — Anesthesia Postprocedure Evaluation (Signed)
Anesthesia Post Note  Patient: Multimedia programmer  Procedure(s) Performed: CESAREAN SECTION WITH BILATERAL TUBAL LIGATION (Bilateral )     Patient location during evaluation: PACU Anesthesia Type: General Level of consciousness: awake and alert Pain management: pain level controlled Vital Signs Assessment: post-procedure vital signs reviewed and stable Respiratory status: spontaneous breathing, nonlabored ventilation and respiratory function stable Cardiovascular status: blood pressure returned to baseline and stable Postop Assessment: no apparent nausea or vomiting Anesthetic complications: no   No complications documented.  Last Vitals:  Vitals:   06/19/20 1715 06/19/20 1730  BP: 118/78 (!) 131/96  Pulse: 93 93  Resp: 16 (!) 21  Temp:    SpO2: 96% 98%    Last Pain:  Vitals:   06/19/20 1730  PainSc: 0-No pain   Pain Goal: Patients Stated Pain Goal: 0 (06/19/20 1700)                 Lowella Curb

## 2020-06-19 NOTE — MAU Note (Addendum)
Pt arrived via EMS around 1509. Pt appeared to be in active labor and not able to appropriately answer questions. She  was shouting and appeared to be pushing. .  RN attempted to get fetal heart tones, which appeared to be bradycardic in 70s-90s.  CNM called to bedside to check patient. .  Cervix checked by CNM : 8 cm/100/. Pt asked by provider if she wanted to attempt a VBAC given advanced cervical dilation, but patient refused.    CNM performed bedside U/S to confirm fetal heart rate, but she was unable to get adequate tracing.   FHT vs. maternal HR confirmed via pulse ox.  Fetal tracing broken d/t patient movement. During this time,  patient's position changed to both left and right lateral and finally hands and knees to address fetal bradycardia. IV, labs, and Covid swab.  Providers called to bedside (Dr. Myriam Jacobson, Dr. Donavan Foil and Dr Adrian Blackwater.) assessing patient. Dr Adrian Blackwater recheck patient's cervix and said she was unchanged at 8cm. Code Ceasarean called by provider. Patient transported to OR. Pt consent obtained at OR door.

## 2020-06-19 NOTE — Discharge Summary (Addendum)
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 yo [id number] 385w3das admitted in Active Labor on 06/19/2020. Patient had a labor course significant for presenting to MAU in active labor at 8 cm. She declined vaginal attempt. The patient went for cesarean section due to Elective Repeat. Delivery details as follows: Membrane Rupture Time/Date: 12:00 AM ,06/19/2020   Delivery Method:C-Section, Low Vertical  Details of operation can be found in separate operative note. Patient had an uncomplicated postpartum course.  She is ambulating,tolerating a regular diet, passing flatus, and urinating well.  Patient is discharged home in stable condition 06/22/20. Referral placed for UroGyn given h/o vesiculocutaneous fistula.  Newborn Data: Birth date:06/19/2020  Birth time:3:38 PM  Gender:Female  Living status:Living  Apgars:9 ,9  Weight:2420 g   Magnesium Sulfate received: No BMZ received: No Rhophylac:N/A MMR:N/A T-DaP:Given prenatally Flu: Yes Transfusion:No  Physical exam  Vitals:   06/21/20 1301 06/21/20 2105 06/22/20 0538 06/22/20 0725  BP: 131/81 132/84 (!) 151/94 140/84  Pulse: 66 78 65   Resp: 18 20 19    Temp: 98.1 F (36.7 C) 98.3 F (36.8 C) 98.3 F (36.8 C)   TempSrc: Oral Oral  Oral   SpO2:   100%    General: alert, cooperative and no distress Lochia: appropriate Uterine Fundus: firm Incision: Healing well with no significant drainage, No significant erythema, Dressing is clean, dry, and intact. Persistent vesico-cutaneous fistula midline and inferior to low transverse incision. DVT Evaluation: No evidence of DVT seen on physical exam. No cords or calf tenderness. No significant calf/ankle edema. Labs: Lab Results  Component Value Date   WBC 10.3 06/21/2020   HGB 9.0 (L) 06/21/2020   HCT 29.0 (L) 06/21/2020   MCV 83.6 06/21/2020   PLT 198 06/21/2020   CMP Latest Ref Rng & Units 06/20/2020  Glucose 70 - 99 mg/dL -  BUN 6 - 20 mg/dL -  Creatinine 0.44 - 1.00 mg/dL 0.68  Sodium 135 - 145 mmol/L -  Potassium 3.5 - 5.1 mmol/L -  Chloride 98 - 111 mmol/L -  CO2 22 - 32 mmol/L -  Calcium 8.9 - 10.3 mg/dL -  Total Protein 6.5 - 8.1 g/dL -  Total Bilirubin 0.3 - 1.2 mg/dL -  Alkaline Phos 38 - 126 U/L -  AST 15 - 41 U/L -  ALT 0 - 44 U/L -   Edinburgh Score: No flowsheet data found.   After visit meds:  Allergies as of 06/22/2020      Reactions   Lactose Intolerance (gi) Other (See Comments)   Upset stomach      Medication List    STOP taking these medications  Blood Pressure Kit Devi     TAKE these medications   acetaminophen 500 MG tablet Commonly known as: TYLENOL Take 2 tablets (1,000 mg total) by mouth every 8 (eight) hours.   amLODipine 5 MG tablet Commonly known as: NORVASC Take 1 tablet (5 mg total) by mouth daily.   amphetamine-dextroamphetamine 5 MG tablet Commonly known as: ADDERALL Take 5 mg by mouth daily.   coconut oil Oil Apply 1 application topically as needed (nipple pain).   ibuprofen 800 MG tablet Commonly known as: ADVIL Take 1 tablet (800 mg total) by mouth every 8 (eight) hours.   oxyCODONE 5 MG immediate release tablet Commonly known as: Oxy IR/ROXICODONE Take 1 tablet (5 mg total) by mouth every 6 (six)  hours as needed for severe pain or breakthrough pain.   PrePLUS 27-1 MG Tabs Take 1 tablet by mouth daily.        Discharge home in stable condition Infant Discharge: to [facility] Discharge instruction: per After Visit Summary and Postpartum booklet. Activity: Advance as tolerated. Pelvic rest for 6 weeks.  Diet: routine diet Future Appointments: Future Appointments  Date Time Provider [place]  06/26/2020  2:00 PM [facility] NURSE [facility]  07/09/2020  9:15 AM [facility]  07/20/2020 10:55 AM Griffin Basil, MD [facility]   Follow up Visit:  Please schedule this patient for a In person postpartum visit in 4 weeks with the following provider: MD. Additional Postpartum F/U:Postpartum Depression checkup and Incision check 1 week  High risk pregnancy complicated by: HTN and depression, vesicocutaneous fistula Delivery mode:  C-Section, Low Vertical  Anticipated Birth Control:  INCOMPLETE BTL, depo prior to discharge with plan for outpatient Nexplanon  Randa Ngo, MD OB Fellow, Faculty Practice 06/22/2020 12:20 PM

## 2020-06-20 ENCOUNTER — Encounter (HOSPITAL_COMMUNITY): Payer: Self-pay | Admitting: Family Medicine

## 2020-06-20 LAB — CBC
HCT: 24.2 % — ABNORMAL LOW (ref 36.0–46.0)
HCT: 25.7 % — ABNORMAL LOW (ref 36.0–46.0)
Hemoglobin: 7.3 g/dL — ABNORMAL LOW (ref 12.0–15.0)
Hemoglobin: 8 g/dL — ABNORMAL LOW (ref 12.0–15.0)
MCH: 24.7 pg — ABNORMAL LOW (ref 26.0–34.0)
MCH: 25.6 pg — ABNORMAL LOW (ref 26.0–34.0)
MCHC: 30.2 g/dL (ref 30.0–36.0)
MCHC: 31.1 g/dL (ref 30.0–36.0)
MCV: 82 fL (ref 80.0–100.0)
MCV: 82.1 fL (ref 80.0–100.0)
Platelets: 156 10*3/uL (ref 150–400)
Platelets: 161 10*3/uL (ref 150–400)
RBC: 2.95 MIL/uL — ABNORMAL LOW (ref 3.87–5.11)
RBC: 3.13 MIL/uL — ABNORMAL LOW (ref 3.87–5.11)
RDW: 17.6 % — ABNORMAL HIGH (ref 11.5–15.5)
RDW: 17.5 % — ABNORMAL HIGH (ref 11.5–15.5)
WBC: 9.4 10*3/uL (ref 4.0–10.5)
WBC: 10.1 10*3/uL (ref 4.0–10.5)
nRBC: 0 % (ref 0.0–0.2)
nRBC: 0 % (ref 0.0–0.2)

## 2020-06-20 LAB — RPR: RPR Ser Ql: NONREACTIVE

## 2020-06-20 LAB — PREPARE RBC (CROSSMATCH)

## 2020-06-20 LAB — CREATININE, SERUM
Creatinine, Ser: 0.68 mg/dL (ref 0.44–1.00)
GFR, Estimated: >60 mL/min (ref >60)

## 2020-06-20 MED ORDER — MEDROXYPROGESTERONE ACETATE 150 MG/ML IM SUSP
150.0000 mg | Freq: Once | INTRAMUSCULAR | Status: DC
  Filled 2020-06-20: qty 1

## 2020-06-20 MED ORDER — SODIUM CHLORIDE 0.9% IV SOLUTION: Freq: Once | INTRAVENOUS | Status: AC

## 2020-06-20 NOTE — Progress Notes (Addendum)
POSTPARTUM PROGRESS NOTE  Subjective: Michelle [name] is a 25 y.o. [id number] s/p repeat LTCS at [redacted]w[redacted]d.  She reports she doing well. No acute events overnight. She denies any problems with ambulating or po intake. Denies nausea or vomiting. She has not yet passed flatus. Pain is moderately controlled.  Lochia is moderate.  Objective: Blood pressure 120/82, pulse 67, temperature 98.3 F (36.8 C), temperature source Axillary, resp. rate 16, SpO2 98 %, unknown if currently breastfeeding.  Physical Exam:  General: alert, cooperative and no distress Chest: no respiratory distress Abdomen: soft, mildly distended, moderate diffuse tenderness to palpation Uterine Fundus: firm and at level of umbilicus Extremities: No calf swelling or tenderness  no LE edema  Recent Labs    06/19/20 1518  HGB 11.0*  HCT 36.4    Assessment/Plan: Michelle [name] is a 25 y.o. [id number] s/p repeat LTCS and right salpingectomy at [redacted]w[redacted]d.   Routine Postpartum Care: Doing well, pain well-controlled.  -- Continue routine care, lactation support  -- Contraception: s/p partial BTL (s/p right salpingectomy but left limited by adhesions). Plan for depo prior to discharge, followed by outpatient Nexplanon. -- Feeding: breast -- Abdominal Pain  Anemia: given Hgb drop to 7.3 down from 11.0 in the setting of diffuse abdominal pain s/p R salpingectomy, concern for possible intra-abdominal bleeding. Pt consented for 1u pRBCs. Will plan to repeat abdominal exam this PM and repeat CBC at 1400. Provided pt with strict instructions to call if concern of worsening symptoms.  Dispo: Plan for discharge POD#2-3.  Sheila Oats, MD OB Fellow, Faculty Practice 06/20/2020 4:50 AM

## 2020-06-20 NOTE — Clinical Social Work Maternal (Signed)
CLINICAL SOCIAL WORK MATERNAL/CHILD NOTE  Patient Details  Name: Michelle [name] MRN: [id number] Date of Birth: 09/11/1995  Date:  06/20/2020  Clinical Social Worker Initiating Note:  Sho Salguero, MSW, LCSWA Date/Time: Initiated:  06/20/20/0940     Child's Name:  Michelle [name]   Biological Parents:  Mother   Need for Interpreter:  None   Reason for Referral:  Late or No Prenatal Care ,Current Substance Use/Substance Use During Pregnancy ,Behavioral Health Concerns   Address:  [address] [place] [address]    Phone number:  [phone] (home)     Additional phone number:   Household Members/Support Persons (HM/SP):   Household Member/Support Person 1   HM/SP Name Relationship DOB or Age  HM/SP -1 Matthew [name] Significant Other 09/09/1983  HM/SP -2        HM/SP -3        HM/SP -4        HM/SP -5        HM/SP -6        HM/SP -7        HM/SP -8          Natural Supports (not living in the home):  Immediate Family   Professional Supports: None   Employment: Disabled   Type of Work:     Education:  Other (comment) (8th Grade)   Homebound arranged:    Financial Resources:  Private Insurance   Other Resources:  WIC,Food Stamps    Cultural/Religious Considerations Which May Impact Care:    Strengths:  Ability to meet basic needs ,Pediatrician chosen,Home prepared for child ,Psychotropic Medications   Psychotropic Medications:  Adderal      Pediatrician:    [place] area  Pediatrician List:   [facility] ([address])  [place]    [place]    [place]    [place]    [place]      Pediatrician Fax Number:    Risk Factors/Current Problems:  Substance Use ,Mental Health Concerns ,Transportation    Cognitive State:  Alert ,Linear Thinking    Mood/Affect:  Interested ,Calm    CSW Assessment: CSW consulted for Hx THC, LPNC @ 30.5 wks,  Depression, Anxiety and Bipolar. CSW met with MOB to complete assessment and offer support.   CSW introduced self and role. CSW observed MOB holding sleeping infant and her sister Vivian also present bedside. CSW requested to speak with MOB alone. Sister was understanding and exited the room. CSW informed MOB of the reason for consult. MOB reported she lives with FOB in a rented home, with the renters family. MOB stated she does not associate with the renters family. MOB reported she receives disability, as well as WIC and food stamps. MOB shared she is currently feeling good but had a stressful pregnancy. MOB disclosed the pregnancy was stressful due to recalling the birth of her previous son (Michelle [name] 10/07/2016), who was adopted by her sisters best friend. MOB reported the adoption was voluntary due to not being able to properly care for the child at the time. MOB denies CPS ever being involved. CSW asked MOB how she feels about caring for infant at this time in her life. MOB reported she is nervous about caring for baby because "he's so fragile." MOB expressed she is still learning how to hold him and feed him. CSW expressed understanding and MOB nervousness. MOB stated FOB will help her with infant's care once she   discharges home. MOB reported her sister who is from [place] will also be staying for the next week, to assist her with the transition. MOB stated her sister will continue to support if additional assistance is needed. MOB provided permission for CSW to make a referral to [facility], who can assist with parenting education and counseling.    MOB disclosed she has a diagnosis of anxiety, bipolar depression, PTSD and schizophrenia.  MOB stated she was first diagnosed at age 12 or 13. MOB reported she was on medications prior to being pregnant, stating she is unable to recall the name. CSW asked MOB about the hallucinations and hospital visit in October. MOB stated when she become really depressed,  she experiences symptoms of schizophrenia. MOB reported that October was the last time she experienced any schizophrenic symptoms. MOB stated she copes with depressive symptoms by taking time alone to think, reading and drawing. CSW asked MOB if she would feel comfortable restarting medications postpartum. MOB reported she would feel comfortable restarting medication and contacting a professional if needs arise.  CSW discussed late prenatal care with MOB. MOB stated she does not have a reason for getting late prenatal care. CSW informed MOB of the hospital drug screen policy. MOB was informed that infant tested positive for amphetamines, due to MOB Adderral prescription (prescribed by Dr. John) and that a CPS notification will be made. MOB was informed a [facility] referral will be made as a result of the notification. CSW asked MOB if she used any additional substnaces during pregnancy.  MOB reported she used recreational marijuana 2 days ago and also smoked cigarettes. MOB informed of risk of smoking with infant. MOB aware a CPS report will be made if infant CDS test positive for THC.   CSW provided education on baby blues period versus PPD. MOB was provided with a New Mom Checklist to assess for needs postpartum. MOB was provided with mental health resources and encouraged to contact a professional if symptoms arise.  CSW provided education on Sudden Infant Death Syndrome (SIDS). MOB reported she has everything needed for infant, including a crib and car seat. MOB disclosed she does not have transportation. CSW set up transportation with [employer] for infant's follow-up appointment on Friday. MOB was informed of pick-up time and instructions for pick-up. MOB expressed understanding.  MOB did not display any acute mental health symptoms during assessment. MOB being followed by BH at [facility]. MOB will have support of FOB and her sister postpartum. Referral also made for [facility].   CSW made CPS  notification for positive UDS. CSW will continue to follow CDS and make a CPS report if warranted. CSW confirmed with DSS that MOB has no previous CPS history. CSW identifies no further need for intervention and no barriers to discharge at this time.  CSW Plan/Description:  No Further Intervention Required/No Barriers to Discharge,Perinatal Mood and Anxiety Disorder (PMADs) Education,Hospital Drug Screen Policy Information,Child Protective Service Report ,CSW Will Continue to Monitor Umbilical Cord Tissue Drug Screen Results and Make Report if Warranted,Sudden Infant Death Syndrome (SIDS) Education,Other Information/Referral to Community Resources,Other Patient/Family Education    Lashana Spang J Diamon Reddinger, LCSWA 06/20/2020, 10:51 AM 

## 2020-06-20 NOTE — Social Work (Signed)
CSW verbally consulted by RN and informed of concerns regarding MOB care for infant. CSW was informed by RN that MOB support, her sister Adonis Huguenin has been the only one caring for infant. RN reported MOB has not cared for infant at all. CSW was informed that MOB's sister temporarily left the room and RN checked on MOB to see if she needed assistance with anything, which she stated no. MOB eventually called the secretary desk stating she needed her sister to return to the room. RN expressed concern regarding MOB's ability to care for infant.   CSW met with MOB to assess. CSW observed MOB's sister present, MOB sitting in chair and infant awake in bassinet. CSW asked MOB if she would like to speak alone or with sister present. MOB requested to speak in private and sister exited the room. CSW asked MOB how she has been doing with caring for infant. MOB reported she has been doing okay. CSW asked MOB if she asked her sister to return to the room to care for baby. MOB reported she did ask her sister to come back to help with infant. MOB stated she was nervous about getting around and that infant needed to eat and have his diaper changed. MOB stated "I didn't want to do it as if I know what I'm doing." CSW asked MOB what her plan is going to be once her sister is no longer in town to assist. MOB reported FOB will be available to help. CSW again asked MOB if she feels comfortable caring for infant alone in the case that FOB is not as involved as she would expect. MOB stated "I think so." MOB went on to share that her sister just wants her to give infant up for adoption because she does not believe MOB is able to care for the child. MOB stated she gave her last child up for adoption and she will not give this child up for adoption. MOB became tearful and  stated "She (sister) is trying to make me do everything by myself." CSW expressed the importance of MOB learning how to care for infant on her own. CSW reiterated the  importance of being hands-on in infant's care. CSW expressed to MOB that it is okay if she does not know everything and to ask questions while she has the support of her nurse and sister. CSW reiterated that her sister will only be here a few days and she will have to be able to care for infant on her own. MOB expressed understanding.   Considering MOB current tearfulness, CSW discussed the importance of having a plan to be proactive regarding her mental health and susceptibility to PPD. CSW encouraged MOB to follow-up with a therapist to talk through her emotions regarding her feelings about her previous child and the adoption. CSW reminded MOB of the list of mentla health resources that she can choose from. CSW also encouraged MOB to talk with her doctor about medication if she feels it would help. MOB again expressed understanding.   When CSW exited the room, MOB's sister informed CSW that she does not believe infant going home with baby will be a good situation. Sister reported MOB behaved the same way when she had her son in 2018. CSW expressed understanding.   CSW made a report to [facility] [place] regarding concerns for infant's care. CSW will follow-up to determine if report has been accepted.  CSW entered MOB room to follow-up and observed MOB's sister changing infant's  diaper. MOB's sister exited the room and MOB was informed that CPS report has been made. MOB expressed understanding. RN was updated.  Barriers to discharge.   Darra Lis, MSW, [place] Work [employer] [phone]

## 2020-06-21 ENCOUNTER — Other Ambulatory Visit (HOSPITAL_COMMUNITY): Payer: Medicare PPO

## 2020-06-21 ENCOUNTER — Encounter (HOSPITAL_COMMUNITY): Payer: Medicare PPO

## 2020-06-21 ENCOUNTER — Other Ambulatory Visit (HOSPITAL_COMMUNITY)
Admission: RE | Admit: 2020-06-21 | Discharge: 2020-06-21 | Disposition: A | Discharge status: Home / Self Care | Payer: Medicare PPO | Source: Ambulatory Visit | Attending: Obstetrics and Gynecology | Admitting: Obstetrics and Gynecology

## 2020-06-21 LAB — CBC
HCT: 29 % — ABNORMAL LOW (ref 36.0–46.0)
Hemoglobin: 9 g/dL — ABNORMAL LOW (ref 12.0–15.0)
MCH: 25.9 pg — ABNORMAL LOW (ref 26.0–34.0)
MCHC: 31 g/dL (ref 30.0–36.0)
MCV: 83.6 fL (ref 80.0–100.0)
Platelets: 198 10*3/uL (ref 150–400)
RBC: 3.47 MIL/uL — ABNORMAL LOW (ref 3.87–5.11)
RDW: 18.1 % — ABNORMAL HIGH (ref 11.5–15.5)
WBC: 10.3 10*3/uL (ref 4.0–10.5)
nRBC: 0.2 % (ref 0.0–0.2)

## 2020-06-21 LAB — TYPE AND SCREEN
ABO/RH(D): O POS
Antibody Screen: NEGATIVE
Unit division: 0

## 2020-06-21 LAB — BPAM RBC
Blood Product Expiration Date: 202203212359
ISSUE DATE / TIME: 202202230747
Unit Type and Rh: 5100

## 2020-06-21 LAB — SURGICAL PATHOLOGY

## 2020-06-21 NOTE — Progress Notes (Addendum)
POSTPARTUM PROGRESS NOTE  Subjective: Michelle [name] is a 25 y.o. [id number] on postpartum day #2 s/p rLTCS w/ right salpingectomy. Pain overall improved. Patient reports tolerating PO, + flatus and no problems voiding.    Objective: Vital signs in last 24 hours: Temp:  [98.2 F (36.8 C)-98.7 F (37.1 C)] 98.2 F (36.8 C) (02/24 0528) Pulse Rate:  [75-82] 80 (02/24 0528) Resp:  [16-18] 17 (02/24 0528) BP: (111-135)/(68-84) 135/84 (02/24 0528) SpO2:  [100 %] 100 % (02/24 0528)  Physical Exam:  General: alert and no distress Lochia: appropriate Uterine Fundus: firm Incision: honeycomb dressing c/d/i DVT Evaluation: Negative Homan's sign. No cords or calf tenderness.  Recent Labs    06/20/20 1402 06/21/20 0513  HGB 8.0* 9.0*  HCT 25.7* 29.0*    Assessment/Plan: Michelle [name] is a 25 y.o. [id number] s/p rLTCS and right salpingectomy at [redacted]w[redacted]d. Routine Postpartum Care: Doing well postoperatively, pain well-controlled, continue current care. #Contraception: s/p right salpingectomy (left limited by adhesions). Plan for depo prior to discharge followed by outpatient Nexplanon #Feeding: breast feeding, lactation support prn #Anemia: Patient received 1u pRBCs for Hgb drop from 11>7.3. Hemoglobin stable at 9.0 this morning. Plan for po iron on discharge. #Elevated BP: BP mildly elevated to 135/84 this morning. Had previously been normal. Will continue to monitor.  Dispo: SW identified barriers to discharge, CPS case filed due to concerns about MOB's ability to care for baby.  Michelle [name] 06/21/2020, 7:16 AM  Attestation of Supervision of Student:  I confirm that I have verified the information documented in the  resident's  note and that I have also personally reperformed the history, physical exam and all medical decision making activities. I have verified that all services and findings are accurately documented in this student's note; and I agree with management and plan as  outlined in the documentation. I have also made any necessary editorial changes.  Sheila Oats, MD Center for [facility], [facility] 06/21/2020 9:07 AM

## 2020-06-21 NOTE — Progress Notes (Signed)
[place] social worker Michelle Dy [place]) came to meet with MOB. CPS social worker met with MOB and verbalized plan to meet with FOB. CPS social worker reported that she planned to return to the hospital afterwards to meet with MOB to complete safety plan with MOB.   Currently there are barriers to discharge.   Michelle [name], [place] Worker [facility] Cell#: [phone]

## 2020-06-21 NOTE — Social Work (Addendum)
CSW spoke with [facility] DSS and confirmed case was accepted. CSW awaiting a call back from CPS SW to determine time of visit.   Update: CPS SW Serita Grammes will complete assessment with MOB today. CSW provided RN and pediatrician with an update.  Barriers to discharge.  Manfred Arch, MSW, [employer] Clinical Social Worker [facility] [facility] [employer]  [phone]

## 2020-06-21 NOTE — Lactation Note (Signed)
This note was copied from a baby's chart. Lactation Consultation Note  Patient Name: Michelle [name] ZGYFV'C Date: 06/21/2020 Reason for consult: Follow-up assessment;Early term 37-38.6wks Age:26 hours LC entered room, infant asleep in basinet. LC unable observe latch due mom recently  giving infant 17 mls of 22 kcal  Similac Neosure with iron prior to [facility] entering the room. Mom has mostly been formula feeding infant, per mom infant BF maybe 3 times today and she only used DEBP maybe 3 times. LC encouraged mom to latch infant at breast first for every feeding,  if breastfeeding is her choice and ask for assistance with latching infant at the breast from RN or LC. LC suggested mom use DEBP every 3 hours for 15 minutes to help establish her milk supply. Mom knows she can call LC to help assist with latching infant at the breast for the next feeding. LC reinforced to BF infant according to cues, 8 to 12+ times or more within 24 hours, STS. Mom unable to tell LC how may stools or voids infant had due her sister changing infant's diapers and recording input and output.     Maternal Data    Feeding Mother's Current Feeding Choice: Breast Milk and Formula Nipple Type: Extra Slow Flow  LATCH Score                    Lactation Tools Discussed/Used    Interventions Interventions: Skin to skin;DEBP;Education  Discharge    Consult Status Consult Status: Follow-up Date: 06/21/20 Follow-up type: In-patient    Danelle Earthly 06/21/2020, 12:18 AM

## 2020-06-21 NOTE — Lactation Note (Signed)
This note was copied from a baby's chart. Lactation Consultation Note  Patient Name: Michelle [name] BTDHR'C Date: 06/21/2020   Age:26 hours  LC talked with RN, Michelle [name], Mom is mostly bottle feeding. DEBP is set up in the room, but she does not seem interested in nursing or pumping according to RN. Mom is primarily bottle feeding with formula.   Maternal Data    Feeding Nipple Type: Extra Slow Flow  LATCH Score                    Lactation Tools Discussed/Used    Interventions    Discharge    Consult Status      Michelle Foody  [name] 06/21/2020, 2:38 PM

## 2020-06-21 NOTE — Progress Notes (Signed)
Patient called out asking for staff to call her sister. I went to room asked patient if there was something I could do she got quiet would not answer. I then said you called out and asked for [place] to call your sister. She then states "just call her". I said I would be glad to but what was her sister number she stated" she did not know the number but we should have it": I found the number listed in the chart asked patient what she wanted me to ask her sister patient said to have her come or call. I called Patient Sister Michelle [name] Told her patient would like to speak with her or see her. [name] stated she would call the patient room phone.

## 2020-06-22 ENCOUNTER — Other Ambulatory Visit: Payer: Self-pay | Admitting: Obstetrics and Gynecology

## 2020-06-22 ENCOUNTER — Encounter: Payer: Self-pay | Admitting: *Deleted

## 2020-06-22 LAB — CULTURE, BETA STREP (GROUP B ONLY): Strep Gp B Culture: NEGATIVE

## 2020-06-22 MED ORDER — HYDROXYZINE HCL 25 MG PO TABS
25.0000 mg | ORAL_TABLET | Freq: Three times a day (TID) | ORAL | Status: DC | PRN
  Administered 2020-06-22: 25 mg via ORAL
  Filled 2020-06-22: qty 1

## 2020-06-22 MED ORDER — AMLODIPINE BESYLATE 5 MG PO TABS
5.0000 mg | ORAL_TABLET | Freq: Every day | ORAL | Status: DC
  Administered 2020-06-22 – 2020-06-23 (×2): 5 mg via ORAL
  Filled 2020-06-22 (×2): qty 1

## 2020-06-22 MED ORDER — FERROUS SULFATE 325 (65 FE) MG PO TABS
325.0000 mg | ORAL_TABLET | ORAL | Status: DC
  Administered 2020-06-23: 325 mg via ORAL
  Filled 2020-06-22: qty 1

## 2020-06-22 MED ORDER — VITAMIN C 250 MG PO TABS
250.0000 mg | ORAL_TABLET | ORAL | Status: DC
  Filled 2020-06-22: qty 1

## 2020-06-22 MED ORDER — AMLODIPINE BESYLATE 5 MG PO TABS: 5.0000 mg | ORAL_TABLET | Freq: Every day | ORAL | 0 refills | Status: AC

## 2020-06-22 MED ORDER — COCONUT OIL OIL: 1.0000 "application " | TOPICAL_OIL | 0 refills | Status: AC | PRN

## 2020-06-22 MED ORDER — OXYCODONE HCL 5 MG PO TABS: 5.0000 mg | ORAL_TABLET | Freq: Four times a day (QID) | ORAL | 0 refills | Status: AC | PRN

## 2020-06-22 MED ORDER — IBUPROFEN 800 MG PO TABS: 800.0000 mg | ORAL_TABLET | Freq: Three times a day (TID) | ORAL | 0 refills | Status: AC

## 2020-06-22 MED ORDER — ACETAMINOPHEN 500 MG PO TABS: 1000.0000 mg | ORAL_TABLET | Freq: Three times a day (TID) | ORAL | 0 refills | Status: AC

## 2020-06-22 MED FILL — ACETAMINOPHEN 500MG XT STRE: 500 | 5 days supply | Qty: 30 | Fill #0

## 2020-06-22 MED FILL — IBUPROFEN 800 MG TAB: 800 | 10 days supply | Qty: 30 | Fill #0

## 2020-06-22 MED FILL — oxyCODONE HCL 5 MG TABS: 5 | 3 days supply | Qty: 15 | Fill #0

## 2020-06-22 MED FILL — AMLODIPINE BESYLATE 5 MG TA: 5 | 45 days supply | Qty: 45 | Fill #0

## 2020-06-22 NOTE — Progress Notes (Addendum)
Postpartum Progress Note Michelle [name] is a 25 y.o. [id number] on PPD#3 after rCS.  S:  RN requested assessment of pt after difficult SW/CPS meeting. Baby removed from pt's care and placed in foster care. Pt very upset, sister has requested pt be allowed to stay another night (out of concern FOB will physically harm mom per RN) and that we give pt  something for anxiety.  Pt in bed, calm but easily tearful. Stated she is not fearful of physical harm from FOB and that he has never hurt her. Feels everyone is mad at her for saying the wrong thing in the meeting. Stated "I just feel really depressed and sad." Verbalized suicidal ideation but does not have a current plan, feels stable in the hospital  but is anxious about her mood once she leaves. Very specifically asked pt if she has a plan for suicide or feels like hurting herself here in the hospital, pt stated no. Not on any depression/anxiety meds.  O:  BP 132/87 (BP Location: Right Arm)   Pulse 62   Temp 98 F (36.7 C) (Oral)   Resp 16   LMP  (LMP Unknown)   SpO2 100%   Breastfeeding Unknown   A/P:  Suicidal ideation in postpartum mother - suicidal precautions not needed in hospital - Vistaril ordered for anxiety - consult to psychiatry placed - not stable for discharge  Bernerd Limbo, CNM 3:10 PM

## 2020-06-22 NOTE — Progress Notes (Addendum)
POSTPARTUM PROGRESS NOTE  Subjective: Michelle [name] is a 25 y.o. [id number] on POD#3 s/p rLTCS with right salpingectomy at [redacted]w[redacted]d. She denies any problems with ambulating, voiding or po intake. Denies nausea or vomiting. She has passed flatus. Pain is well controlled.  Lochia is appropriate.  Objective: Blood pressure 140/84, pulse 65, temperature 98.3 F (36.8 C), temperature source Oral, resp. rate 19, SpO2 100 %, unknown if currently breastfeeding.  Physical Exam:  General: alert, cooperative and no distress Chest: no respiratory distress Abdomen: soft, appropriately tender. Honeycomb dressing c/d/i Skin: previously outlined erythema on abdomen has improved slightly Uterine Fundus: firm and at level of umbilicus Extremities: No calf swelling or tenderness  Recent Labs    06/20/20 1402 06/21/20 0513  HGB 8.0* 9.0*  HCT 25.7* 29.0*    Assessment/Plan: Michelle [name] is a 25 y.o. [id number] s/p rLTCS and right salpingectomy at [redacted]w[redacted]d.  Routine Postpartum Care: Doing well, pain well-controlled.  -- Continue routine care, lactation support  -- Contraception: s/p right salpingectomy (left limited by adhesions). Plan for depo prior to discharge -- Feeding: breast/bottle -- Circ: desired  #Vesicocutaneous fistula: Honeycomb dressing changed overnight due to saturation with urine. Tegaderm placed over fistula to prevent additional leakage. Will plan for outpatient urogyn referral.  #Elevated BP: BP elevated to 151/94 this morning, repeat 140/84. Will start Norvasc 5mg  and monitor BP  #h/o bipolar disorder, depression: Patient inquiring about restarting her psych meds. She does not know the names of her prior medications other than Adderall. Unable to find additional records in [facility].   -needs outpatient psych f/u  Dispo: pending recommendations from SW/CPS.    [facility], MD PGY-1 Family Medicine 06/22/2020 7:42 AM  Attestation of Supervision of Student:  I  confirm that I have verified the information documented in the  resident's  note and that I have also personally reperformed the history, physical exam and all medical decision making activities.  I have verified that all services and findings are accurately documented in this student's note; and I agree with management and plan as outlined in the documentation. I have also made any necessary editorial changes.  06/24/2020, MD Center for [facility], [facility] 06/22/2020 12:00 PM

## 2020-06-22 NOTE — Social Work (Signed)
CSW attended Child and Family Team Meeting with [facility]  DSS. In attendance were CSW, MOB, FOB Tomasa Blase, Serita Grammes (CPS SW), Regan Rakers (CPS Supervisor), and Paulla Dolly (CPS Facilitator).  [facility] DSS is filing a petition on infant. CSW updated pediatrician and RN. CSW will continue to follow and assist with discharge as needed.   MOB has no barriers to discharge.  Manfred Arch, LCSWA Clinical Social Work [employer] and [employer] [phone]

## 2020-06-22 NOTE — Care Management Important Message (Signed)
Important Message  Patient Details  Name: Michelle [name] MRN: [id number] Date of Birth: [birth year]   Medicare Important Message Given:  Yes     Renie Ora 06/22/2020, 11:02 AM

## 2020-06-22 NOTE — Social Work (Signed)
MOB was extremely emotional and tearful following the Child and Family Team Meeting with CPS. CSW took some time to speak with MOB in an attempt to console. CSW spoke with MOB's MD to provide an update on MOB emotional state and to see if MOB has any medication options considering her mental health history and previous request to restart medication.  CSW also provided MOB and sister with information on the Behavioral Health Urgent Care in the case that MOB needs immediate attention postpartum. MOB also has additional resources previously provided.  MOB and sister were made aware of MOB one week mood check appointment.   Manfred Arch, MSW, LCSWA Clinical Social Work [employer] and [employer]

## 2020-06-23 ENCOUNTER — Other Ambulatory Visit: Payer: Self-pay | Admitting: Obstetrics and Gynecology

## 2020-06-23 ENCOUNTER — Inpatient Hospital Stay (HOSPITAL_COMMUNITY): Admit: 2020-06-23 | Payer: Medicare PPO | Admitting: Obstetrics and Gynecology

## 2020-06-23 DIAGNOSIS — F4321 Adjustment disorder with depressed mood: Secondary | ICD-10-CM

## 2020-06-23 MED ORDER — FERROUS SULFATE 325 (65 FE) MG PO TABS: 325.0000 mg | ORAL_TABLET | ORAL | 1 refills | Status: AC

## 2020-06-23 NOTE — Social Work (Signed)
CSW attempted to check on MOB to see how she is doing. CSW observed MOB sleeping and FOB also present sleeping on couch.  CSW will follow-up with MOB later in the day.  Manfred Arch, MSW, [employer] Clinical Social Work [employer] [facility] [employer] [phone]

## 2020-06-23 NOTE — Consult Note (Signed)
[facility] Face-to-Face Psychiatry Consult   Reason for Consult: '' Pt's baby placed with foster care, pt now having suicidal ideation, not stable for discharge.''  Referring Physician: Candelaria Celeste, DO Patient Identification: Michelle [name] MRN:  [id number] Principal Diagnosis: Bipolar affective disorder [facility]) Diagnosis:  Principal Problem:   Bipolar affective disorder [facility]) Active Problems:   Cesarean delivery delivered   Total Time spent with patient: 1 hour  Subjective:   Michelle [name] is a 25 y.o. female patient admitted for delivery.  HPI:  Patient who reports prior history of Bipolar affective disorder, [id number] on POD#4 s/p rLTCS with right salpingectomy who was admitted for delivery. Patient reports that she receiving mental health treatment at [facility] in [place] but  was weaned off medications after she got pregnant almost a year ago. She states that she was doing fine until yesterday when she was informed that her new born baby has been placed in foster care because she had informed the social worker that she might not be able to care for the baby due to her mental health. She reports feeling sad about the decision yesterday but denies suicidal ideations or plan. Today, she is alert, awake, cooperative, denies self harming thoughts, psychosis, delusions and depression. She intend to get back to mental health treatment at [facility] in [place] upon discharge.   Past Psychiatric History: as above  Risk to Self:  denies Risk to Others:  denies Prior Inpatient Therapy:   Prior Outpatient Therapy:   [facility] in [place]   Past Medical History:  Past Medical History:  Diagnosis Date  . Anxiety   . Bipolar disorder (HCC)   . Depression   . Hypertension   . Vaginal Pap smear, abnormal     Past Surgical History:  Procedure Laterality Date  . CESAREAN SECTION    . CESAREAN SECTION WITH BILATERAL TUBAL LIGATION Bilateral  06/19/2020   Procedure: CESAREAN SECTION WITH BILATERAL TUBAL LIGATION;  Surgeon: Levie Heritage, DO;  Location: [facility] LD [facility]S;  Service: Obstetrics;  Laterality: Bilateral;  . CYSTOSCOPY    . SUPRAPUBIC CATHETER INSERTION     Family History:  Family History  Adopted: Yes  Problem Relation Age of Onset  . Drug abuse Mother   . Drug abuse Father    Family Psychiatric  History:  Social History:  Social History   Substance and Sexual Activity  Alcohol Use Never     Social History   Substance and Sexual Activity  Drug Use Yes  . Types: Marijuana, Amphetamines, Benzodiazepines   Comment: 5-6 months as of 05/2020    Social History   Socioeconomic History  . Marital status: Married    Spouse name: Not on file  . Number of children: Not on file  . Years of education: Not on file  . Highest education level: Not on file  Occupational History  . Not on file  Tobacco Use  . Smoking status: Current Some Day Smoker    Packs/day: 0.50    Years: 5.00    Pack years: 2.50    Types: Cigarettes  . Smokeless tobacco: Never Used  Vaping Use  . Vaping Use: Never used  Substance and Sexual Activity  . Alcohol use: Never  . Drug use: Yes    Types: Marijuana, Amphetamines, Benzodiazepines    Comment: 5-6 months as of 05/2020  . Sexual activity: Yes    Birth control/protection: None  Other Topics Concern  . Not on file  Social History Narrative  . Not on file   Social Determinants of Health   Financial Resource Strain: Not on file  Food Insecurity: No Food Insecurity  . Worried About Charity fundraiser in the Last Year: Never true  . Ran Out of Food in the Last Year: Never true  Transportation Needs: Unmet Transportation Needs  . Lack of Transportation (Medical): Yes  . Lack of Transportation (Non-Medical): Yes  Physical Activity: Not on file  Stress: Not on file  Social Connections: Not on file   Additional Social History:    Allergies:   Allergies  Allergen Reactions  .  Lactose Intolerance (Gi) Other (See Comments)    Upset stomach    Labs: No results found for this or any previous visit (from the past 48 hour(s)).  Current Facility-Administered Medications  Medication Dose Route Frequency Provider Last Rate Last Admin  . acetaminophen (TYLENOL) tablet 1,000 mg  1,000 mg Oral Q6H Janet Berlin, MD   1,000 mg at 06/23/20 6811  . amLODipine (NORVASC) tablet 5 mg  5 mg Oral Daily Alcus Dad, MD   5 mg at 06/23/20 0857  . coconut oil  1 application Topical PRN Janet Berlin, MD      . witch hazel-glycerin (TUCKS) pad 1 application  1 application Topical PRN Marsala, Placido Sou, MD       And  . dibucaine (NUPERCAINAL) 1 % rectal ointment 1 application  1 application Rectal PRN Janet Berlin, MD      . diphenhydrAMINE (BENADRYL) capsule 25 mg  25 mg Oral Q6H PRN Janet Berlin, MD      . enoxaparin (LOVENOX) injection 40 mg  40 mg Subcutaneous Q24H Janet Berlin, MD   40 mg at 06/23/20 0857  . ferrous sulfate tablet 325 mg  325 mg Oral Patsey Berthold, MD      . hydrOXYzine (ATARAX/VISTARIL) tablet 25 mg  25 mg Oral TID PRN Gaylan Gerold R, CNM   25 mg at 06/22/20 2045  . ibuprofen (ADVIL) tablet 800 mg  800 mg Oral Q6H Janet Berlin, MD   800 mg at 06/23/20 5726  . lactated ringers infusion   Intravenous Continuous Janet Berlin, MD 125 mL/hr at 06/20/20 0554 New Bag at 06/20/20 0554  . measles, mumps & rubella vaccine (MMR) injection 0.5 mL  0.5 mL Subcutaneous Once Janet Berlin, MD      . medroxyPROGESTERone (DEPO-PROVERA) injection 150 mg  150 mg Intramuscular Once Randa Ngo, MD      . menthol-cetylpyridinium (CEPACOL) lozenge 3 mg  1 lozenge Oral Q2H PRN Janet Berlin, MD      . oxyCODONE (Oxy IR/ROXICODONE) immediate release tablet 5-10 mg  5-10 mg Oral Q4H PRN Janet Berlin, MD   5 mg at 06/22/20 2045  . prenatal multivitamin tablet 1 tablet  1 tablet Oral Q1200 Janet Berlin, MD   1 tablet at 06/22/20  1259  . senna-docusate (Senokot-S) tablet 2 tablet  2 tablet Oral Q24H Janet Berlin, MD   2 tablet at 06/23/20 0857  . simethicone (MYLICON) chewable tablet 80 mg  80 mg Oral TID PC Janet Berlin, MD   80 mg at 06/23/20 0857  . simethicone (MYLICON) chewable tablet 80 mg  80 mg Oral PRN Janet Berlin, MD      . Tdap (BOOSTRIX) injection 0.5 mL  0.5 mL Intramuscular Once Janet Berlin, MD      .  vitamin C (ASCORBIC ACID) tablet 250 mg  250 mg Oral Patsey Berthold, MD      . zolpidem (AMBIEN) tablet 5 mg  5 mg Oral QHS PRN Berniece Andreas Placido Sou, MD        Musculoskeletal: Strength & Muscle Tone: within normal limits Gait & Station: normal Patient leans: N/A  Psychiatric Specialty Exam: Physical Exam Psychiatric:        Attention and Perception: Attention and perception normal.        Mood and Affect: Affect is blunt.        Speech: Speech normal.        Behavior: Behavior normal. Behavior is cooperative.        Thought Content: Thought content normal.        Cognition and Memory: Cognition and memory normal.        Judgment: Judgment normal.     Review of Systems  Constitutional: Negative.   HENT: Negative.   Eyes: Negative.   Respiratory: Negative.   Gastrointestinal: Negative.   Endocrine: Negative.   Musculoskeletal: Negative.   Psychiatric/Behavioral: Negative.     Blood pressure 123/71, pulse 62, temperature 98.2 F (36.8 C), temperature source Oral, resp. rate 18, SpO2 99 %, unknown if currently breastfeeding.There is no height or weight on file to calculate BMI.  General Appearance: Casual  Eye Contact:  Good  Speech:  Clear and Coherent  Volume:  Normal  Mood:  Dysphoric  Affect:  Appropriate  Thought Process:  Coherent and Linear  Orientation:  Full (Time, Place, and Person)  Thought Content:  Logical  Suicidal Thoughts:  No  Homicidal Thoughts:  No  Memory:  Immediate;   Good Recent;   Good Remote;   Good  Judgement:  Intact  Insight:   Fair  Psychomotor Activity:  Normal  Concentration:  Concentration: Fair and Attention Span: Fair  Recall:  Good  Fund of Knowledge:  Fair  Language:  Good  Akathisia:  No  Handed:  Right  AIMS (if indicated):     Assets:  Communication Skills Desire for Improvement  ADL's:  Intact  Cognition:  WNL  Sleep:        Treatment Plan Summary: 25 year old female with history of Bipolar affective disorder who was admitted for child delivery. However, she had expressed sadness after she learned that her child has been placed in foster care after she voiced her inability to care for the child due to her mental illness. Today, she is alert, awake, cooperative, denies self harming thoughts, psychosis, delusions and depression. She intend to get back to mental health treatment at [facility] in [place] upon discharge. Patient does not meet criteria for psychiatric inpatient admission.  Recommendation: Patient will benefit from referral to [facility] group mental health services in [place] upon discharge.   Disposition: No evidence of imminent risk to self or others at present.   Patient does not meet criteria for psychiatric inpatient admission. Supportive therapy provided about ongoing stressors. Psychiatric service signing out. Re-consult as needed.  Corena Pilgrim, MD 06/23/2020 11:08 AM

## 2020-06-23 NOTE — Progress Notes (Addendum)
POSTPARTUM PROGRESS NOTE  Subjective: Michelle [name] is a 25 y.o. [id number] on POD#4 s/p rLTCS with right salpingectomy at [redacted]w[redacted]d. She denies any problems with ambulating, voiding or po intake. Denies nausea or vomiting. She has passed flatus and had a BM. Pain is well controlled.  Lochia is appropriate.  Of note, patient expressed depressed mood and SI yesterday after CPS/SW informed pt that baby would be placed in foster care. This morning she continues to endorse depressed mood but denies SI. States she feels safe at home.  Objective: Blood pressure 138/89, pulse 62, temperature 98.2 F (36.8 C), temperature source Oral, resp. rate 18, SpO2 99 %, unknown if currently breastfeeding.  Physical Exam:  General: alert, cooperative and no distress Chest: no respiratory distress Abdomen: soft, appropriately tender. Honeycomb dressing c/d/i Uterine Fundus: firm and at level of umbilicus Extremities: No calf swelling or tenderness Psych: flat affect  Recent Labs    06/20/20 1402 06/21/20 0513  HGB 8.0* 9.0*  HCT 25.7* 29.0*    Assessment/Plan: [name] is a 25 y.o. [id number] POD#4 rLTCS and right salpingectomy at [redacted]w[redacted]d.  Routine Postpartum Care: Doing well, pain well-controlled.  -- Continue routine PP care -- Contraception: s/p right salpingectomy (left limited by adhesions). Plan for depo prior to discharge, ordered. Desires nexplanon at postpartum visit  #Elevated BP: BP remains elevated to 138/89 this morning. Continue Norvasc 5mg  daily and monitor BP.  #h/o bipolar disorder, depression, recent SI:  Patient expressed SI and depressed mood yesterday after being informed her baby would be placed in foster care.  Patient and family inquiring about restarting her psych meds. She does not know the names of her prior medications other than Adderall. Unable to find additional records in [facility].  -Psychiatry consult placed, appreciate recommendations  #Vesicocutaneous  fistula: Honeycomb dressing remains clean/dry/intact. Will plan for outpatient urogyn referral.  Dispo: pending psych consult   [facility], MD PGY-1 Family Medicine 06/23/2020 7:27 AM   GME ATTESTATION:  I saw and evaluated the patient. I agree with the findings and the plan of care as documented in the resident's note.  06/25/2020, MD OB Fellow, Faculty [facility], Center for [facility] 06/23/2020 8:24 AM

## 2020-06-23 NOTE — Discharge Instructions (Signed)
Please follow-up with the [facility] for mental health support  Please see a list of other behavioral health services below: Psychiatric [facility]  [address], [place], [place] [phone]  [facility] [address], [place], [place] [phone] or [phone] [email or web address]  [facility], 8:30-5:00 [address] [place], [place] [phone] [email or web address]  *Bring your own interpreter at 1st visit  [facility] [address], [place], [place] [phone] [email or web address]   [facility]  [address] [place], [place] [phone]  [facility] Mon-Fri, 8am-3pm [address] [place], [place] [phone] [email or web address]  [facility] [address] [place], [place], [place] [phone]  [facility] [address] [place], [place] [phone]  [facility] [address] [place], [place] [phone] *pacientes que hablen espanol, favor comunicarse con el Sr. [place], extension 2244 o la [address], extension [place] para hacer una cita.   [facility] [address] "[place]" [phone] (Habla Espanol)  [facility] [address] [place], [place], [place] [phone]  [facility] [address], [place], [place] [phone]   [facility]: [facility]aling and Empowering All Survivors)  [address], [place], [place] [phone] [email or web address]  *Uninsured and underinsured, ages 19-64  [facility] [address] [place], [place], [place] [phone] (Habla Espanol)  [facility] [address],  [place], [place] [phone] (Habla Espanol)  [facility] [address], [place], [place] [phone] Clarice Pole)  [facility] Mon-Thurs 8:30am-8:00pm/ Fri 8:30-7:00pm [address] [place], [place] (3rd floor, located at corner of [employer] and [employer]) Call [phone] to schedule an appointment [email or web address]  [facility] [address], [place], [place]  [phone]  [facility]  [address], [place], [place] [phone]  Social Support [facility] ([facility] [place]) 8481697284 or [email or web address] [address], [place], [place] [address] * Recovery support and educational programs, including recovery skills classes, support groups, and one-on-one sessions with [place] Certified Peer Support Specialists.    NAMI [facility] of the Mentally Ill) [place] NAMI Helpline: (501)366-2016 * Family and Friends Support Group/  Contact Philomena Doheny at [phone] for more information * Family to [employer] and Basics Class : enroll online or email Darreld Mclean at [email or web address]  * Monthly educational meetings, contact Dwain Sarna at [phone] [email or web address]    24- Hour Availability:  [facility] [phone] or [phone]  * [facility] of the [employer] (Domestic Violence, Rape, Victim Assistance) Line 705-134-5760  Vesta Mixer (901)359-9642 or 806-035-5174  * [facility]  [phone] only(865)419-9639 hours)  *[facility] [phone]  *[place] National Suicide Hotline 620 250 0723   Postpartum Care After Cesarean Delivery This sheet gives you information about how to care for yourself from the time you deliver your baby to up to 6-12 weeks after delivery (postpartum period). Your health care provider may  also give you more specific instructions. If you have problems or questions, contact your health care provider. Follow these instructions at home: Medicines  Take over-the-counter and prescription medicines only as told by your health care provider.  If you were prescribed an antibiotic medicine, take it as told by your health care provider. Do not stop taking the antibiotic even if you start to feel better.  Ask your health  care provider if the medicine prescribed to you: ? Requires you to avoid driving or using heavy machinery. ? Can cause constipation. You may need to take actions to prevent or treat constipation, such as:  Drink enough fluid to keep your urine pale yellow.  Take over-the-counter or prescription medicines.  Eat foods that are high in fiber, such as beans, whole grains, and fresh fruits and vegetables.  Limit foods that are high in fat and processed sugars, such as fried or sweet foods. Activity  Gradually return to your normal activities as told by your health care provider.  Avoid activities that take a lot of effort and energy (are strenuous) until approved by your health care provider. Walking at a slow to moderate pace is usually safe. Ask your health care provider what activities are safe for you. ? Do not lift anything that is heavier than your baby or 10 lb (4.5 kg) as told by your health care provider. ? Do not vacuum, climb stairs, or drive a car for as long as told by your health care provider.  If possible, have someone help you at home until you are able to do your usual activities yourself.  Rest as much as possible. Try to rest or take naps while your baby is sleeping. Vaginal bleeding  It is normal to have vaginal bleeding (lochia) after delivery. Wear a sanitary pad to absorb vaginal bleeding and discharge. ? During the first week after delivery, the amount and appearance of lochia is often similar to a menstrual period. ? Over the next few weeks,  it will gradually decrease to a dry, yellow-brown discharge. ? For most women, lochia stops completely by 4-6 weeks after delivery. Vaginal bleeding can vary from woman to woman.  Change your sanitary pads frequently. Watch for any changes in your flow, such as: ? A sudden increase in volume. ? A change in color. ? Large blood clots.  If you pass a blood clot, save it and call your health care provider to discuss. Do not flush blood clots down the toilet before you get instructions from your health care provider.  Do not use tampons or douches until your health care provider says this is safe.  If you are not breastfeeding, your period should return 6-8 weeks after delivery. If you are breastfeeding, your period may return anytime between 8 weeks after delivery and the time that you stop breastfeeding. Perineal care  If your C-section (Cesarean section) was unplanned, and you were allowed to labor and push before delivery, you may have pain, swelling, and discomfort of the tissue between your vaginal opening and your anus (perineum). You may also have an incision in the tissue (episiotomy) or the tissue may have torn during delivery. Follow these instructions as told by your health care provider: ? Keep your perineum clean and dry as told by your health care provider. Use medicated pads and pain-relieving sprays and creams as directed. ? If you have an episiotomy or vaginal tear, check the area every day for signs of infection. Check for:  Redness, swelling, or pain.  Fluid or blood.  Warmth.  Pus or a bad smell. ? You may be given a squirt bottle to use instead of wiping to clean the perineum area after you go to the bathroom. As you start healing, you may use the squirt bottle before wiping yourself. Make sure to wipe gently. ? To relieve pain caused by an episiotomy, vaginal tear, or hemorrhoids, try taking a warm  sitz bath 2-3 times a day. A sitz bath is a warm water bath that is taken  while you are sitting down. The water should only come up to your hips and should cover your buttocks.   Breast care  Within the first few days after delivery, your breasts may feel heavy, full, and uncomfortable (breast engorgement). You may also have milk leaking from your breasts. Your health care provider can suggest ways to help relieve breast discomfort. Breast engorgement should go away within a few days.   If you are not breastfeeding: ? Avoid touching your breasts as this can make your breasts produce more milk. ? Wear a well-fitting bra and use cold packs to help with swelling. ? Do not squeeze out (express) milk. This causes you to make more milk. Intimacy and sexuality  Ask your health care provider when you can engage in sexual activity. This may depend on your: ? Risk of infection. ? Healing rate. ? Comfort and desire to engage in sexual activity.  You are able to get pregnant after delivery, even if you have not had your period. If desired, talk with your health care provider about methods of family planning or birth control (contraception). Lifestyle  Do not use any products that contain nicotine or tobacco, such as cigarettes, e-cigarettes, and chewing tobacco. If you need help quitting, ask your health care provider.  Do not drink alcohol, especially if you are breastfeeding. Eating and drinking  Drink enough fluid to keep your urine pale yellow.  Eat high-fiber foods every day. These may help prevent or relieve constipation. High-fiber foods include: ? Whole grain cereals and breads. ? Brown rice. ? Beans. ? Fresh fruits and vegetables.  Take your prenatal vitamins until your postpartum checkup or until your health care provider tells you it is okay to stop.   General instructions  Keep all follow-up visits for you and your baby as told by your health care provider. Most women visit their health care provider for a postpartum checkup within the first 3-6 weeks  after delivery. Contact a health care provider if you:  Feel unable to cope with the changes that a new baby brings to your life, and these feelings do not go away.  Feel unusually sad or worried.  Have breasts that are painful, hard, or turn red.  Have a fever.  Have trouble holding urine or keeping urine from leaking.  Have little or no interest in activities you used to enjoy.  Have not breastfed at all and you have not had a menstrual period for 12 weeks after delivery.  Have stopped breastfeeding and you have not had a menstrual period for 12 weeks after you stopped breastfeeding.  Have questions about caring for yourself or your baby.  Pass a blood clot from your vagina. Get help right away if you:  Have chest pain.  Have difficulty breathing.  Have sudden, severe leg pain.  Have severe pain or cramping in your abdomen.  Bleed from your vagina so much that you fill more than one sanitary pad in one hour. Bleeding should not be heavier than your heaviest period.  Develop a severe headache.  Faint.  Have blurred vision or spots in your vision.  Have a bad-smelling vaginal discharge.  Have thoughts about hurting yourself or your baby. If you ever feel like you may hurt yourself or others, or have thoughts about taking your own life, get help right away. You can go to your nearest emergency department  or call:  Your local emergency services (911 in the [place]  A suicide crisis helpline, such as the National Suicide Prevention Lifeline at 825 260 69971-9058214779. This is open 24 hours a day. Summary  The period of time from when you deliver your baby to up to 6-12 weeks after delivery is called the postpartum period.  Gradually return to your normal activities as told by your health care provider.  Keep all follow-up visits for you and your baby as told by your health care provider. This information is not intended to replace advice given to you by your health care  provider. Make sure you discuss any questions you have with your health care provider. Document Revised: 12/02/2017 Document Reviewed: 12/02/2017 Elsevier Patient Education  2021 [employer]

## 2020-06-23 NOTE — Social Work (Signed)
CSW met with MOB. CSW observed MOB eating and FOB in restroom. CSW asked MOB how she is doing today. MOB reported she is feeling better. CSW asked MOB denies having any thoughts of SI or HI. CSW asked MOB if she feels safe, MOB reported yes.  CSW asked MOB if she has the information for [facility] to follow-up for mental health assistance. MOB stated she has the information and was open to receiving it again. CSW asked MOB if a referral can be sent to [facility] on her behalf. MOB agreed and provided CSW permission to send referral for services. CSW provided MOB with information for CPS SW, as she requested.  MOB expressed no additional needs at this time.   CSW identifies no further need for intervention and no barriers to discharge at this time.  Darra Lis, [place] [employer] [phone]

## 2020-06-26 ENCOUNTER — Ambulatory Visit: Payer: Self-pay

## 2020-07-09 ENCOUNTER — Ambulatory Visit: Payer: Medicare PPO | Admitting: Clinical

## 2020-07-09 DIAGNOSIS — Z5329 Procedure and treatment not carried out because of patient's decision for other reasons: Secondary | ICD-10-CM

## 2020-07-09 DIAGNOSIS — Z91199 Patient's noncompliance with other medical treatment and regimen due to unspecified reason: Secondary | ICD-10-CM

## 2020-07-09 NOTE — BH Specialist Note (Signed)
Pt did not arrive to video visit and did not answer the phone; Left HIPPA-compliant message to call back Lillieanna Tuohy from [facility] at  [phone] (Donnetta Gillin's office).  ?; left MyChart message for patient.  ? ?

## 2020-07-12 ENCOUNTER — Encounter: Payer: Self-pay | Admitting: *Deleted

## 2020-07-20 ENCOUNTER — Ambulatory Visit: Payer: Self-pay | Admitting: Obstetrics and Gynecology

## 2020-07-23 ENCOUNTER — Encounter: Payer: Self-pay | Admitting: *Deleted

## 2020-07-31 ENCOUNTER — Ambulatory Visit: Payer: Medicare PPO | Admitting: Obstetrics and Gynecology

## 2020-08-01 ENCOUNTER — Telehealth: Payer: Self-pay | Admitting: *Deleted

## 2020-08-01 NOTE — Telephone Encounter (Signed)
A my chart message sent to pt regarding how she is to go about getting her medical records.  She appears to be a pt @ [facility].  I gave her the address and told her that she had to sign a ROI in order to have her records either sent or retrieved from another office.

## 2022-06-03 IMAGING — DX DG CHEST 1V PORT
1 series · 1 of 1 positions shown · non-contrast
Comparison: None.

CLINICAL DATA: Altered mental status

EXAM:
PORTABLE CHEST 1 VIEW

[chest ap]
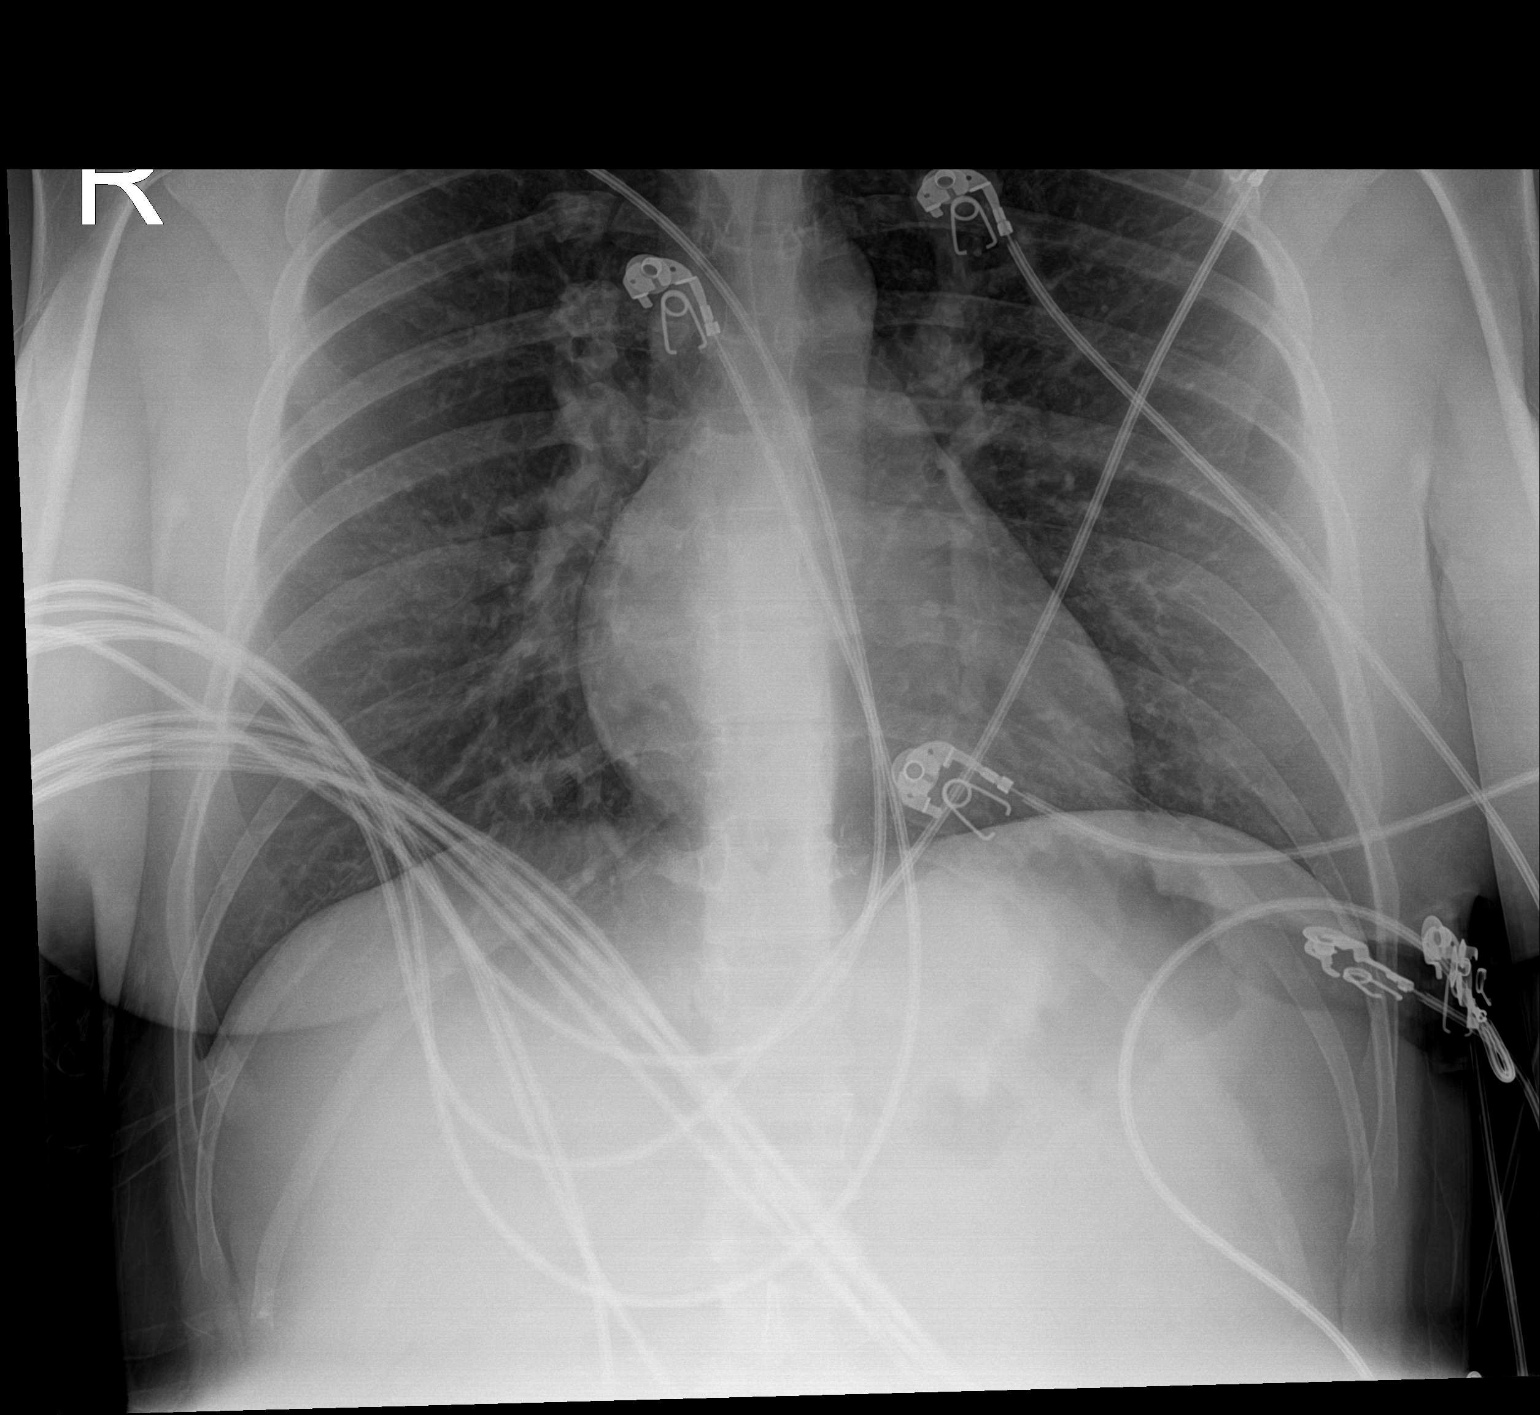

[1 of 1 positions shown; findings below may reference images not displayed]

FINDINGS: No consolidation, features of edema, pneumothorax, or effusion.
Pulmonary vascularity is normally distributed. The cardiomediastinal
contours are unremarkable. No acute osseous or soft tissue
abnormality. Telemetry leads overlie the chest.
IMPRESSION: No acute cardiopulmonary abnormality.

## 2022-06-04 IMAGING — US US OB LIMITED
1 series · 14 of 16 positions shown · non-contrast
Comparison: none

CLINICAL DATA: Abdominal cramping

EXAM:
LIMITED OBSTETRIC ULTRASOUND

[Series 1: us ob less than 14 weeks with ob transvaginal · 14 of 16 slices shown]
[im 1/16]
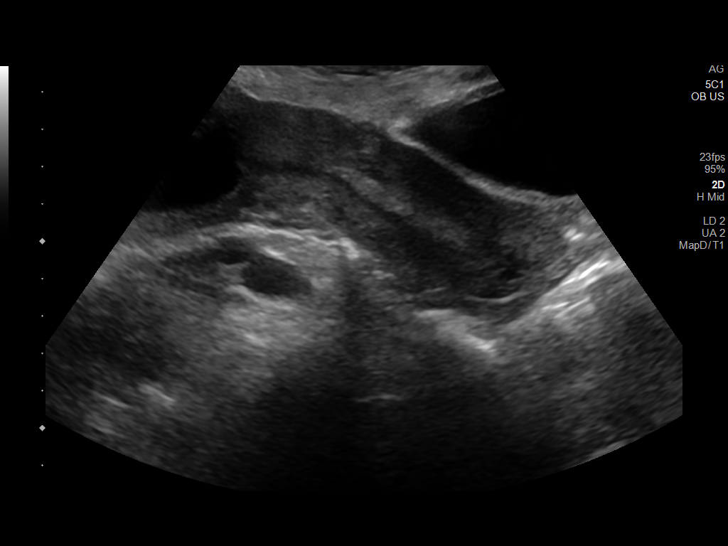
[im 2/16]
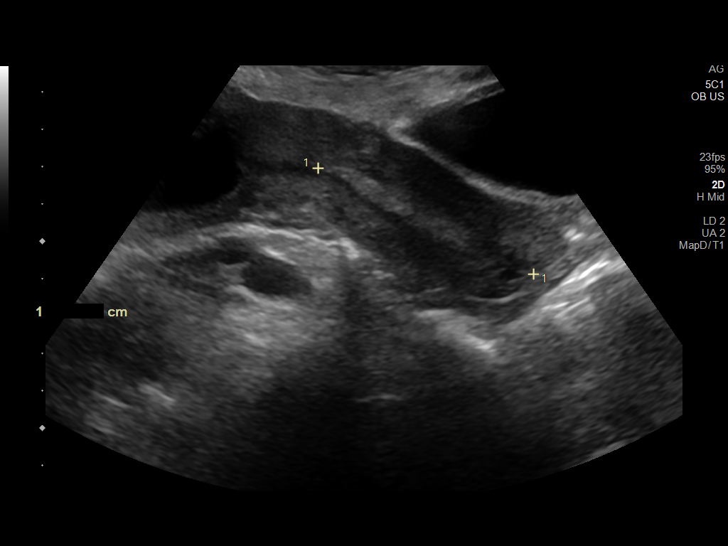
[im 3/16]
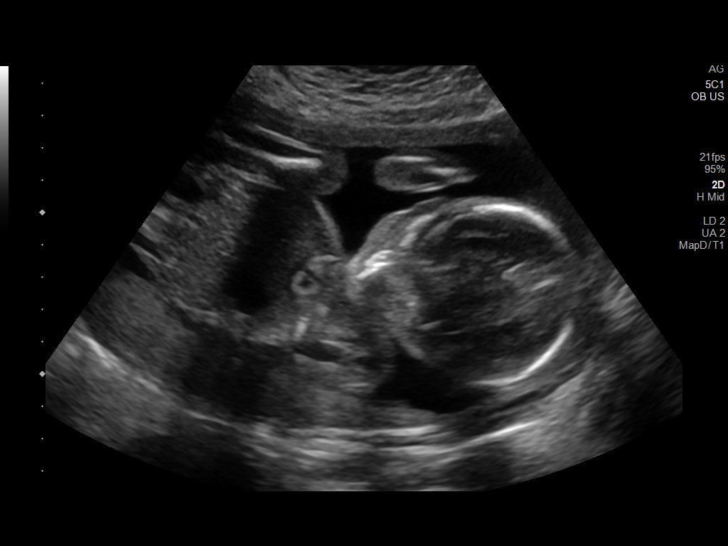
[im 5/16]
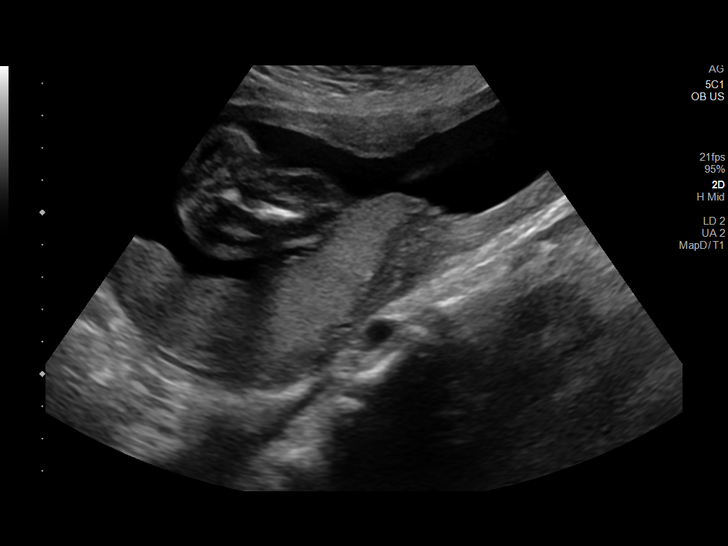
[im 6/16]
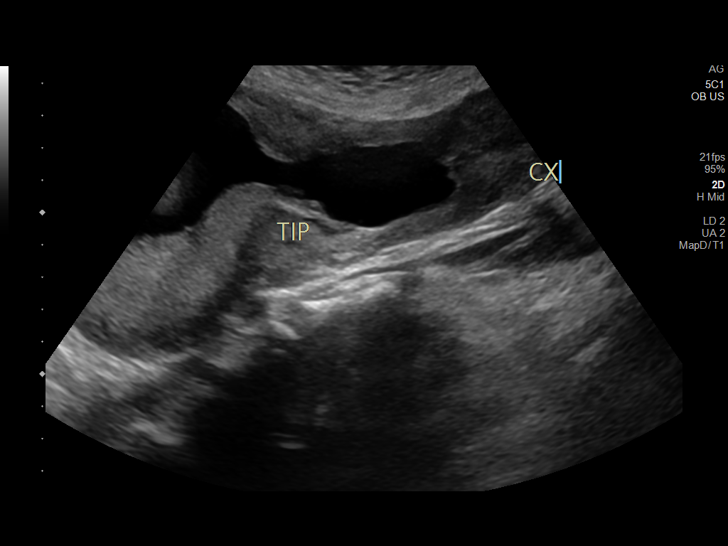
[im 7/16]
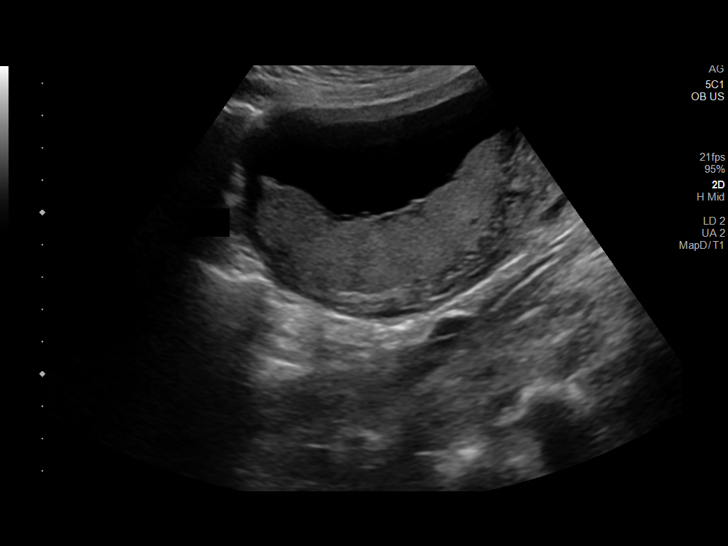
[im 8/16]
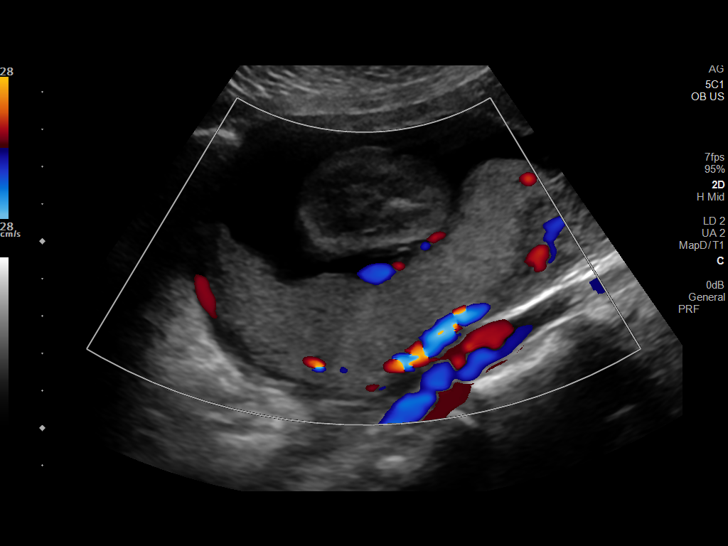
[im 9/16]
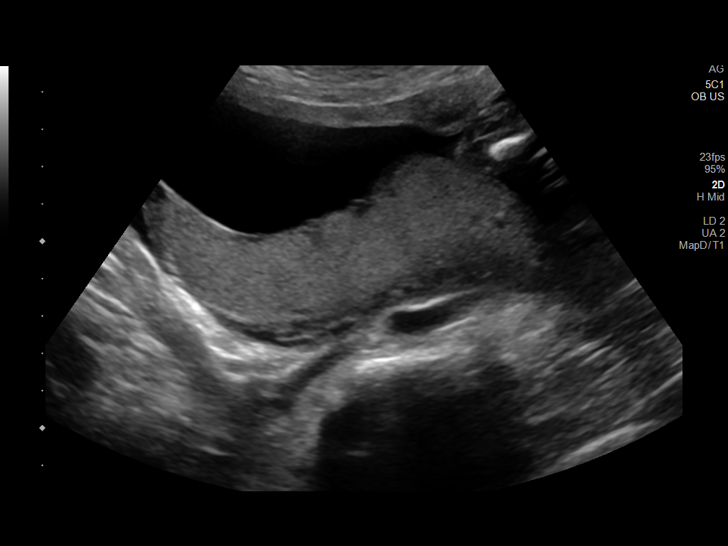
[im 10/16]
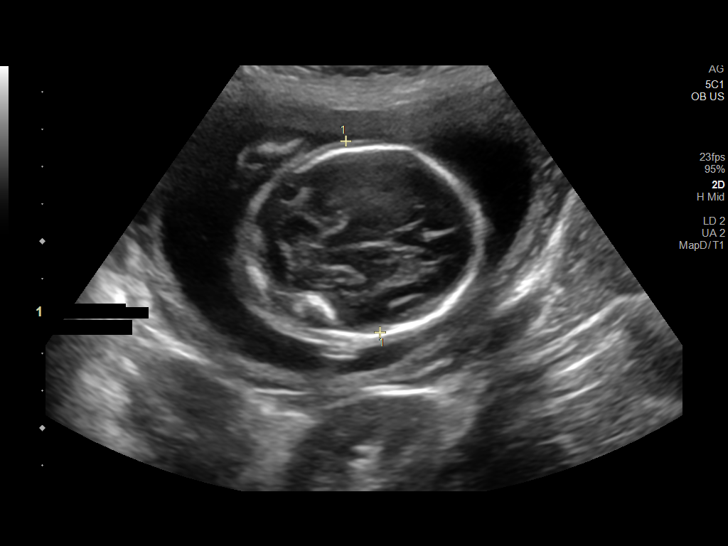
[im 11/16]
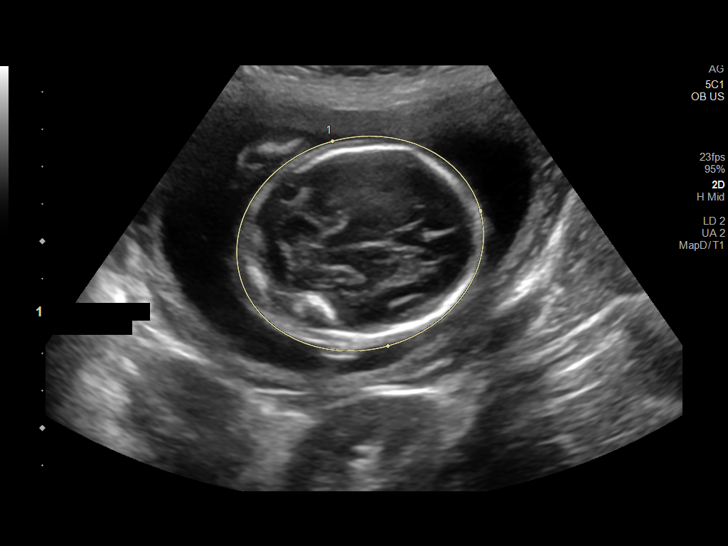
[im 13/16]
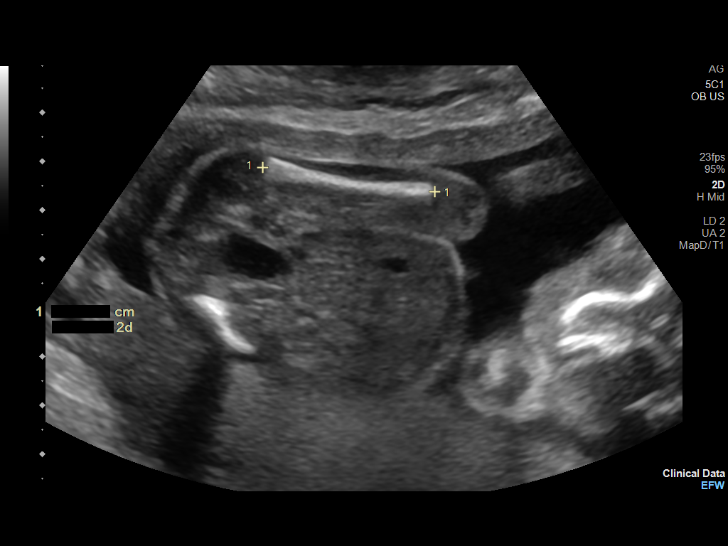
[im 14/16]
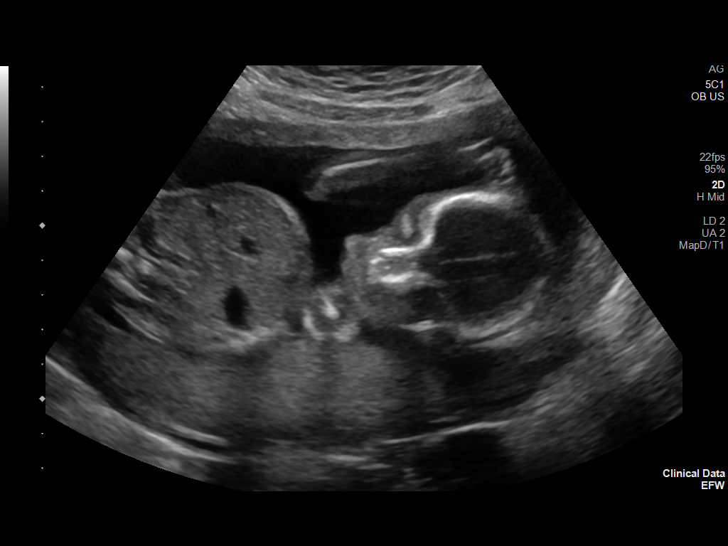
[im 15/16]
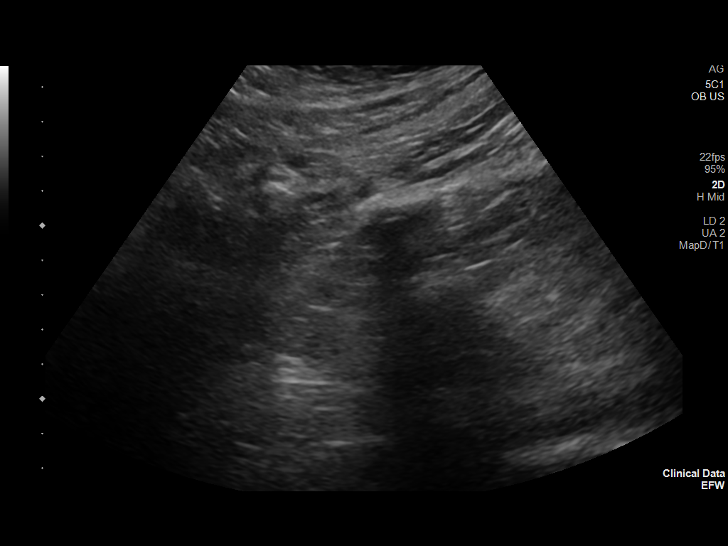
[im 16/16]
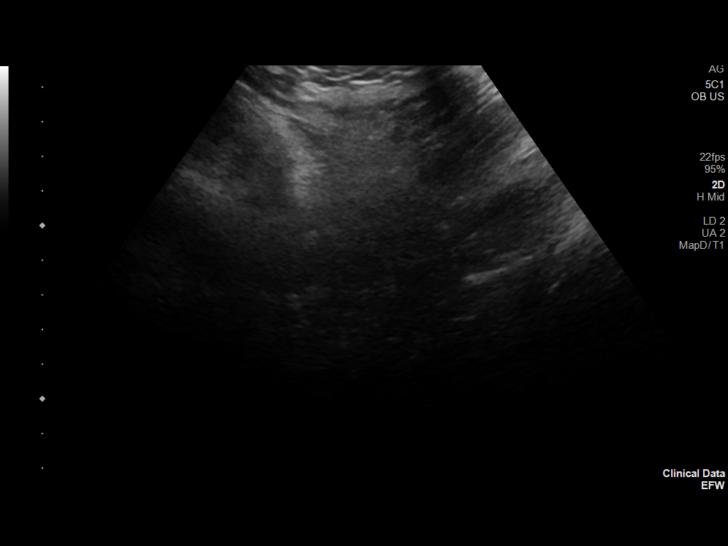

[14 of 16 positions shown; findings below may reference images not displayed]

FINDINGS: Number of Fetuses: 1

Heart Rate:  136 bpm

Movement: Yes per sonographer exam

Presentation: Transverse

Placental Location: Posterior

Previa: Absent

Amniotic Fluid (Subjective):  Within normal limits.

BPD: 5.2 cm 21 w  5 d

MATERNAL FINDINGS:

Cervix:  Appears closed.

Uterus/Adnexae: No abnormality visualized.
IMPRESSION: 1. Unremarkable exam.
2. Gestational age of 21 weeks 5 days by biparietal diameter.
3. This exam is performed on an emergent basis and does not
comprehensively evaluate fetal size, dating, or anatomy; follow-up
complete OB US should be considered if further fetal assessment is
warranted.
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
## 2022-09-17 IMAGING — US US MFM OB FOLLOW-UP
1 series · 13 of 28 positions shown · non-contrast
Comparison: none

[Series 1: us mfm ob follow-up · 68 acquisitions, 13 frames shown]
[im 3/68]
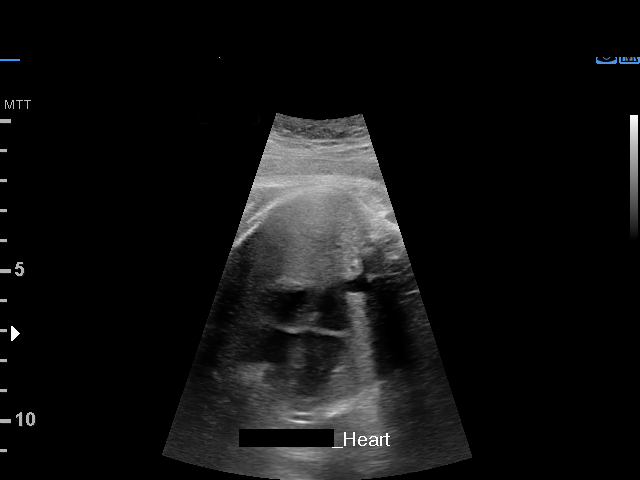
[im 8/68]
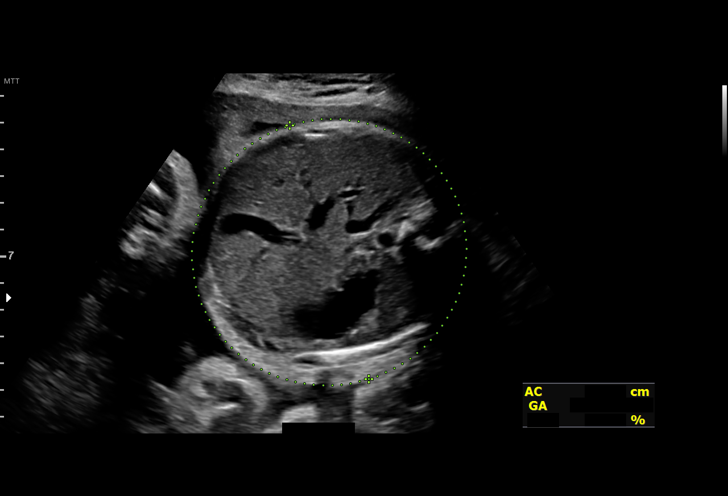
[im 13/68]
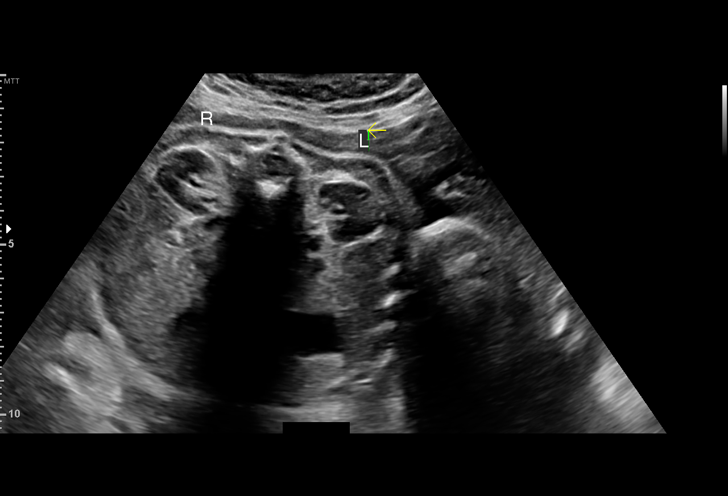
[im 18/68]
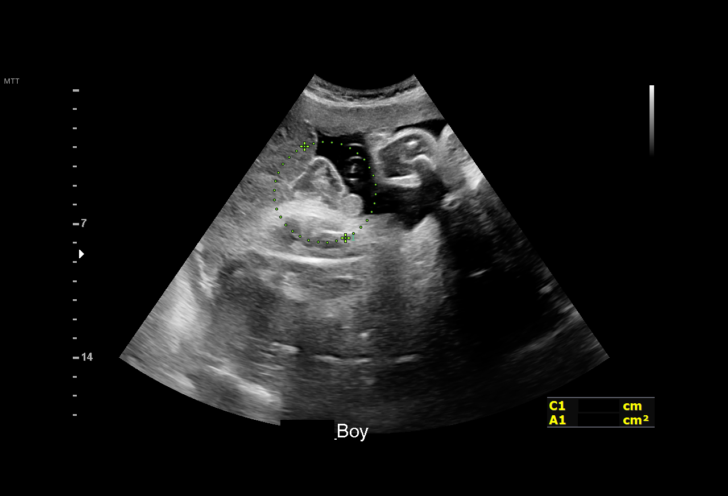
[im 23/68]
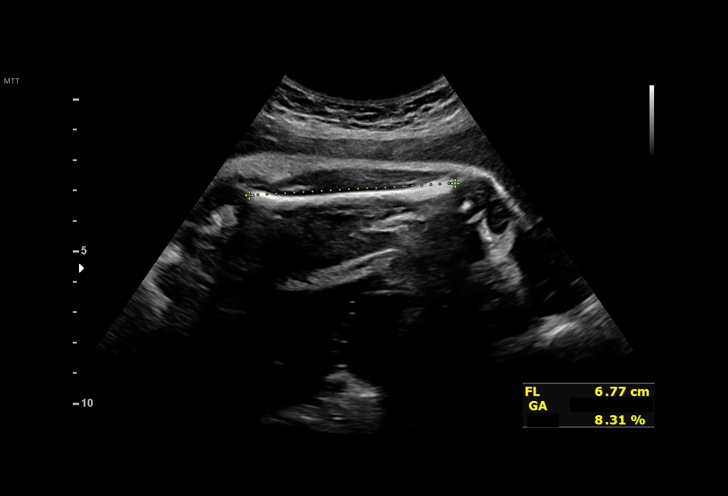
[im 28/68]
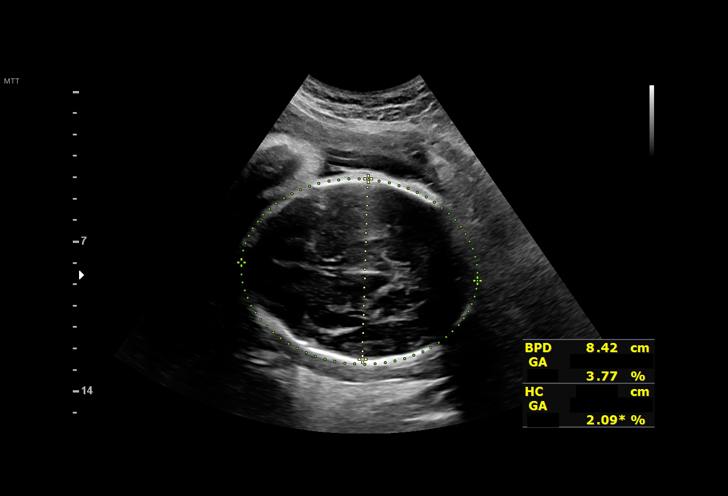
[im 35/68]
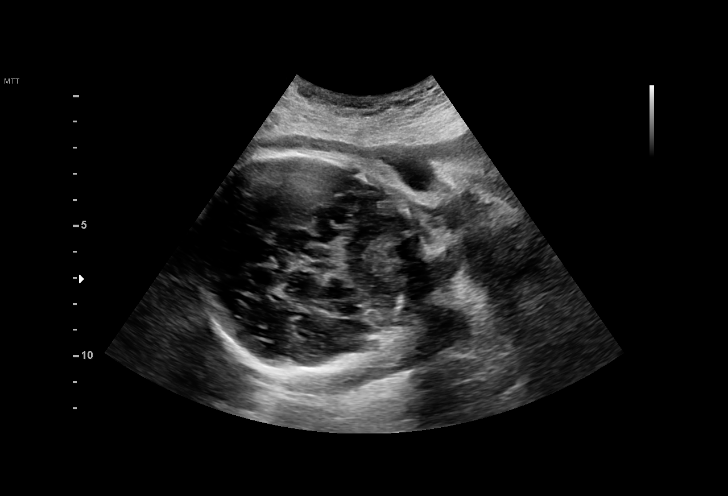
[im 40/68]
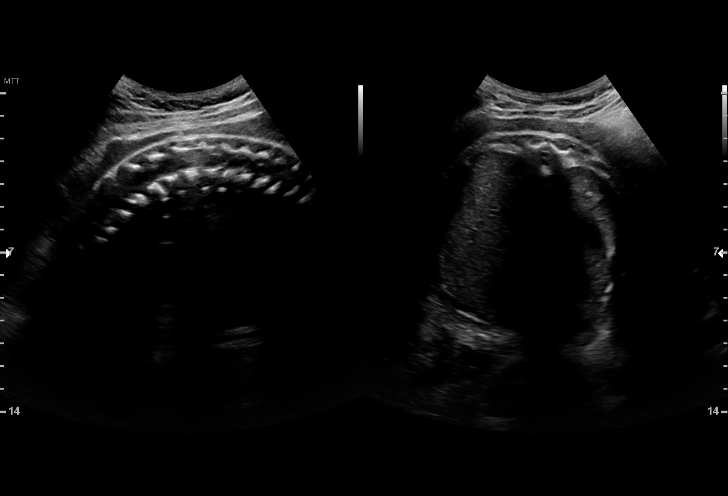
[im 45/68]
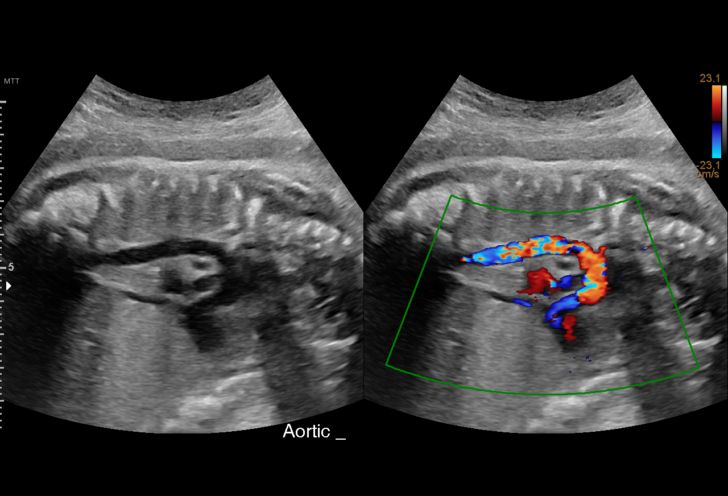
[im 50/68]
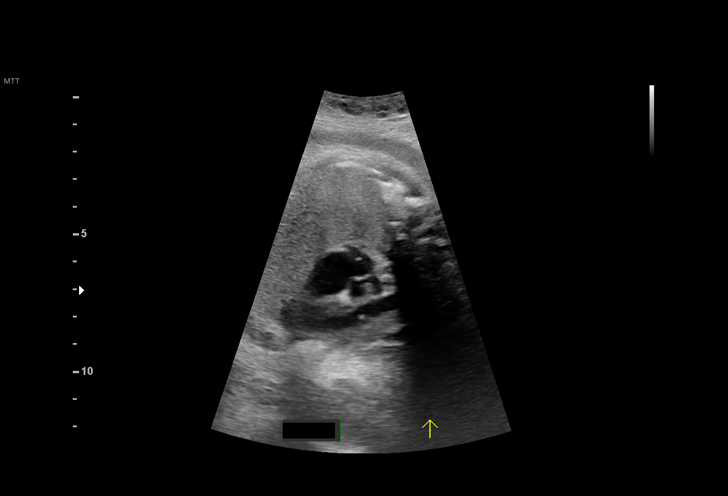
[im 55/68]
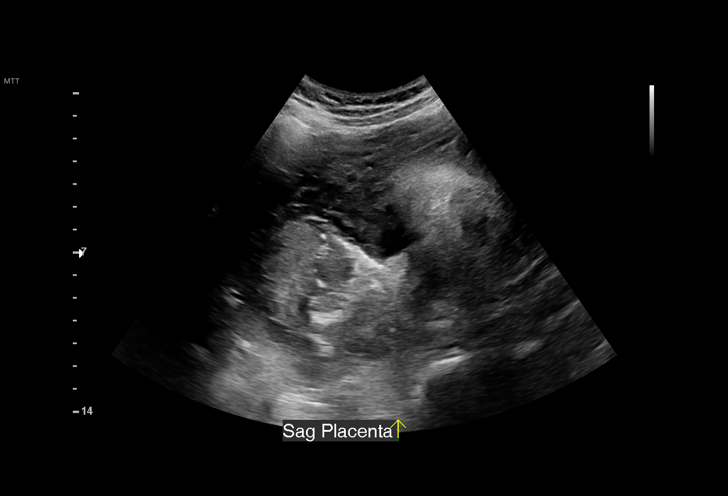
[im 60/68]
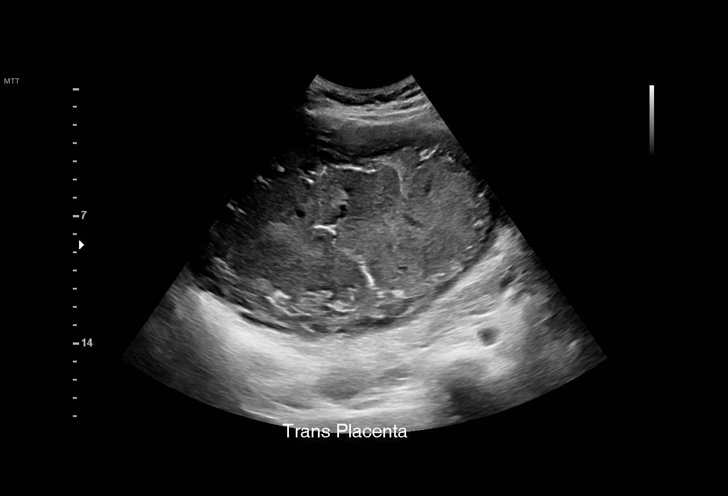
[im 65/68]
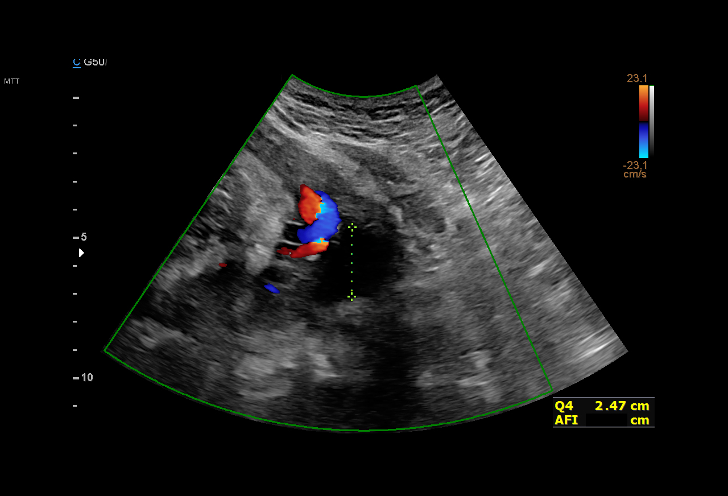

[13 of 28 positions shown; findings below may reference images not displayed]

[id number]

Indications

 36 weeks gestation of pregnancy
 Encounter for antenatal screening for
 malformations
 Hypertension - Chronic/Pre-existing
 Tobacco use complicating pregnancy, third
 trimester
 Late to prenatal care, third trimester
 Previous cesarean delivery, antepartum x 1
Vital Signs

                                                Height:        4'11"
Fetal Evaluation

 Num Of Fetuses:         1
 Fetal Heart Rate(bpm):  140
 Cardiac Activity:       Observed
 Presentation:           Cephalic
 Placenta:               Posterior Fundal
 P. Cord Insertion:      Not well visualized
 Amniotic Fluid
 AFI FV:      Within normal limits

 AFI Sum(cm)     %Tile       Largest Pocket(cm)
 11.2            32          6

 RUQ(cm)       RLQ(cm)       LUQ(cm)        LLQ(cm)
 6             2.5           2.7            0
Biometry

 BPD:      84.7  mm     G. Age:  34w 1d          5  %    CI:        75.81   %    70 - 86
                                                         FL/HC:      21.7   %    20.8 -
 HC:      308.4  mm     G. Age:  34w 3d        < 1  %    HC/AC:      0.97        0.92 -
 AC:      317.2  mm     G. Age:  35w 5d         32  %    FL/BPD:     79.0   %    71 - 87
 FL:       66.9  mm     G. Age:  34w 3d          5  %    FL/AC:      21.1   %    20 - 24
 HUM:      59.6  mm     G. Age:  34w 4d         28  %
 CER:      49.4  mm     G. Age:  36w 4d         43  %

 LV:        4.4  mm

 Est. FW:    1404  gm    5 lb 11 oz      15  %
OB History

 Gravidity:    3         Term:   1        Prem:   0        SAB:   1
 TOP:          0       Ectopic:  0        Living: 1
Gestational Age

 U/S Today:     34w 5d                                        EDD:   07/14/20
 Best:          36w 5d     Det. By:  Previous Ultrasound      EDD:   06/30/20
                                     (02/23/20)
Anatomy

 Cranium:               Appears normal         Aortic Arch:            Appears normal
 Cavum:                 Appears normal         Ductal Arch:            Not well visualized
 Ventricles:            Appears normal         Diaphragm:              Appears normal
 Choroid Plexus:        Previously seen        Stomach:                Appears normal, left
                                                                       sided
 Cerebellum:            Previously seen        Abdomen:                Appears normal
 Posterior Fossa:       Previously seen        Abdominal Wall:         Appears nml (cord
                                                                       insert, abd wall)
 Nuchal Fold:           Not applicable (>20    Cord Vessels:           Previously seen
                        wks GA)
 Face:                  Orbits nl prev.;       Kidneys:                Appear normal
                        profile not well vis.
 Lips:                  Not well visualized    Bladder:                Appears normal
 Thoracic:              Appears normal         Spine:                  Limited views
                                                                       appear normal
 Heart:                 Appears normal         Upper Extremities:      Visualized
                        (4CH, axis, and
                        situs)
 RVOT:                  Previously seen        Lower Extremities:      Previously seen
 LVOT:                  Appears normal

 Other:  Male gender previously seen. Heels previously seen.  Technically
         difficult due to advanced GA and fetal position.
Cervix Uterus Adnexa

 Cervix
 Not visualized (advanced GA >93wks)

 Uterus
 No abnormality visualized.

 Right Ovary
 Within normal limits.

 Left Ovary
 Within normal limits.

 Cul De Sac
 No free fluid seen.

 Adnexa
 No abnormality visualized.
Impression

 Chronic hypertension. Well-controlled without
 antihypertensives .
 Amniotic fluid is normal and good fetal activity is seen .Fetal
 growth is appropriate for gestational age .

 We reassured the patient of the findings.
Recommendations

 Follow-up scans as clinically indicated.
                 Allison, Phillep
